# Patient Record
Sex: Male | Born: 1975 | Race: Black or African American | Hispanic: No | Marital: Single | Smoking: Former smoker
Health system: Southern US, Community
[De-identification: ages and names within clinical notes are randomized; demographics above are authoritative.]

## PROBLEM LIST (undated history)

## (undated) ENCOUNTER — Emergency Department (HOSPITAL_COMMUNITY): Discharge status: Absent without leave | Payer: Self-pay

## (undated) DIAGNOSIS — I10 Essential (primary) hypertension: Secondary | ICD-10-CM

## (undated) HISTORY — PX: KNEE SURGERY: SHX244

---

## 2011-06-12 ENCOUNTER — Emergency Department: Payer: Self-pay | Admitting: Unknown Physician Specialty

## 2011-07-23 ENCOUNTER — Emergency Department: Payer: Self-pay | Admitting: Emergency Medicine

## 2011-07-24 ENCOUNTER — Emergency Department: Payer: Self-pay | Admitting: Emergency Medicine

## 2011-07-29 ENCOUNTER — Emergency Department: Payer: Self-pay | Admitting: Emergency Medicine

## 2011-09-28 ENCOUNTER — Emergency Department: Payer: Self-pay | Admitting: Emergency Medicine

## 2011-09-30 ENCOUNTER — Emergency Department: Payer: Self-pay | Admitting: *Deleted

## 2011-10-02 ENCOUNTER — Emergency Department: Payer: Self-pay | Admitting: Emergency Medicine

## 2011-10-16 ENCOUNTER — Emergency Department: Payer: Self-pay | Admitting: Emergency Medicine

## 2011-12-24 ENCOUNTER — Emergency Department: Payer: Self-pay | Admitting: Emergency Medicine

## 2012-12-08 ENCOUNTER — Emergency Department: Payer: Self-pay | Admitting: Internal Medicine

## 2012-12-15 ENCOUNTER — Emergency Department: Payer: Self-pay | Admitting: Internal Medicine

## 2013-02-16 ENCOUNTER — Emergency Department: Payer: Self-pay | Admitting: Emergency Medicine

## 2013-02-21 ENCOUNTER — Emergency Department: Payer: Self-pay | Admitting: Emergency Medicine

## 2013-03-14 ENCOUNTER — Emergency Department: Payer: Self-pay | Admitting: Unknown Physician Specialty

## 2013-03-14 LAB — BASIC METABOLIC PANEL
Anion Gap: 7 (ref 7–16)
Chloride: 102 mmol/L (ref 98–107)
Co2: 27 mmol/L (ref 21–32)
EGFR (Non-African Amer.): >60
Osmolality: 270 (ref 275–301)
Potassium: 3.6 mmol/L (ref 3.5–5.1)
Sodium: 136 mmol/L (ref 136–145)

## 2013-03-14 LAB — CBC
HCT: 46.7 % (ref 40.0–52.0)
MCV: 79 fL — ABNORMAL LOW (ref 80–100)
Platelet: 226 10*3/uL (ref 150–440)
RBC: 5.93 10*6/uL — ABNORMAL HIGH (ref 4.40–5.90)
RDW: 13.7 % (ref 11.5–14.5)
WBC: 14.9 10*3/uL — ABNORMAL HIGH (ref 3.8–10.6)

## 2013-03-14 LAB — TROPONIN I: Troponin-I: <0.02 ng/mL

## 2013-09-26 ENCOUNTER — Emergency Department: Payer: Self-pay | Admitting: Emergency Medicine

## 2013-10-10 ENCOUNTER — Emergency Department: Payer: Self-pay | Admitting: Emergency Medicine

## 2014-10-22 ENCOUNTER — Emergency Department: Payer: Self-pay | Admitting: Physician Assistant

## 2014-11-17 ENCOUNTER — Encounter (HOSPITAL_COMMUNITY): Payer: Self-pay | Admitting: Emergency Medicine

## 2014-11-17 ENCOUNTER — Emergency Department (HOSPITAL_COMMUNITY)
Admission: EM | Admit: 2014-11-17 | Discharge: 2014-11-17 | Disposition: A | Discharge status: Home / Self Care | Payer: Self-pay | Attending: Emergency Medicine | Admitting: Emergency Medicine

## 2014-11-17 DIAGNOSIS — S76301A Unspecified injury of muscle, fascia and tendon of the posterior muscle group at thigh level, right thigh, initial encounter: Secondary | ICD-10-CM

## 2014-11-17 DIAGNOSIS — X58XXXA Exposure to other specified factors, initial encounter: Secondary | ICD-10-CM | POA: Insufficient documentation

## 2014-11-17 DIAGNOSIS — S76312A Strain of muscle, fascia and tendon of the posterior muscle group at thigh level, left thigh, initial encounter: Secondary | ICD-10-CM | POA: Insufficient documentation

## 2014-11-17 DIAGNOSIS — S76901A Unspecified injury of unspecified muscles, fascia and tendons at thigh level, right thigh, initial encounter: Secondary | ICD-10-CM | POA: Insufficient documentation

## 2014-11-17 DIAGNOSIS — Y9389 Activity, other specified: Secondary | ICD-10-CM | POA: Insufficient documentation

## 2014-11-17 DIAGNOSIS — Y998 Other external cause status: Secondary | ICD-10-CM | POA: Insufficient documentation

## 2014-11-17 DIAGNOSIS — Z720 Tobacco use: Secondary | ICD-10-CM | POA: Insufficient documentation

## 2014-11-17 DIAGNOSIS — Y929 Unspecified place or not applicable: Secondary | ICD-10-CM | POA: Insufficient documentation

## 2014-11-17 MED ORDER — OXYCODONE-ACETAMINOPHEN 5-325 MG PO TABS
1.0000 | ORAL_TABLET | Freq: Once | ORAL | Status: AC
  Administered 2014-11-17: 1 via ORAL
  Filled 2014-11-17: qty 1

## 2014-11-17 MED ORDER — OXYCODONE-ACETAMINOPHEN 5-325 MG PO TABS: 1.0000 | ORAL_TABLET | ORAL | Status: DC | PRN

## 2014-11-17 MED ORDER — DIAZEPAM 5 MG PO TABS: 5.0000 mg | ORAL_TABLET | Freq: Four times a day (QID) | ORAL | Status: DC | PRN

## 2014-11-17 MED ORDER — IBUPROFEN 800 MG PO TABS: 800.0000 mg | ORAL_TABLET | Freq: Three times a day (TID) | ORAL | Status: DC

## 2014-11-17 MED ORDER — DIAZEPAM 5 MG PO TABS
10.0000 mg | ORAL_TABLET | Freq: Once | ORAL | Status: AC
  Administered 2014-11-17: 10 mg via ORAL
  Filled 2014-11-17: qty 2

## 2014-11-17 NOTE — ED Notes (Signed)
Pt sts he was wrestling around this evening and felt a pull in bilateral hamstrings. C/o cont pain.

## 2014-11-17 NOTE — ED Provider Notes (Signed)
CSN: [id number]     Arrival date & time 11/17/14  0229 History   First MD Initiated Contact with Patient 11/17/14 0243     Chief Complaint  Patient presents with  . Leg Pain     (Consider location/radiation/quality/duration/timing/severity/associated sxs/prior Treatment) Patient is a 39 y.o. male presenting with leg pain. The history is provided by the patient and the spouse. No language interpreter was used.  Leg Pain Location:  Leg Leg location:  L upper leg and R upper leg Associated symptoms: no fever   Associated symptoms comment:  He was lifting his girlfriend/spouse up while play wrestling and felt a sudden onset searing pain in the back of bilateral thighs, making it difficult for the patient to walk or move the legs. The pain progressed over time prompting ED evaluation.    History reviewed. No pertinent past medical history. Past Surgical History  Procedure Laterality Date  . Knee surgery     No family history on file. History  Substance Use Topics  . Smoking status: Current Every Day Smoker  . Smokeless tobacco: Not on file  . Alcohol Use: No    Review of Systems  Constitutional: Negative for fever and chills.  Gastrointestinal: Negative.  Negative for nausea.  Musculoskeletal: Negative.        See HPI  Skin: Negative.  Negative for color change and wound.  Neurological: Negative.  Negative for weakness and numbness.      Allergies  Review of patient's allergies indicates no known allergies.  Home Medications   Prior to Admission medications   Not on File   BP 118/76 mmHg  Pulse 69  Temp(Src) 98.5 F (36.9 C) (Oral)  Resp 20  Ht 5\' 11"  (1.803 m)  Wt 215 lb (97.523 kg)  BMI 30.00 kg/m2  SpO2 98% Physical Exam  Constitutional: He is oriented to person, place, and time. He appears well-developed and well-nourished.  Neck: Normal range of motion.  Cardiovascular: Intact distal pulses.   Pulmonary/Chest: Effort normal.  Abdominal: There is no  tenderness.  Musculoskeletal: Normal range of motion.  No visualized deformity to posterior lower extremities. Full knee flexion with pain. No apparent tendon deficit. Muscle body of hamstrings soft, tender, without induration.   Neurological: He is alert and oriented to person, place, and time.  Skin: Skin is warm and dry. No erythema.  Psychiatric: He has a normal mood and affect.    ED Course  Procedures (including critical care time) Labs Review Labs Reviewed - No data to display  Imaging Review No results found.   EKG Interpretation None      MDM   Final diagnoses:  None    1. Bilateral hamstring strain  Do not suspect rupture of hamstrings tendons. Pain is improved with medication. Patient is able to be discharged with prn orthopedic follow up.    Elpidio AnisShari Johneisha Broaden, PA-C 11/17/14 [id number]  Mirian MoMatthew Gentry, MD 11/17/14 0600

## 2014-11-17 NOTE — Discharge Instructions (Signed)
Cryotherapy °Cryotherapy means treatment with cold. Ice or gel packs can be used to reduce both pain and swelling. Ice is the most helpful within the first 24 to 48 hours after an injury or flare-up from overusing a muscle or joint. Sprains, strains, spasms, burning pain, shooting pain, and aches can all be eased with ice. Ice can also be used when recovering from surgery. Ice is effective, has very few side effects, and is safe for most people to use. °PRECAUTIONS  °Ice is not a safe treatment option for people with: °· Raynaud phenomenon. This is a condition affecting small blood vessels in the extremities. Exposure to cold may cause your problems to return. °· Cold hypersensitivity. There are many forms of cold hypersensitivity, including: °· Cold urticaria. Red, itchy hives appear on the skin when the tissues begin to warm after being iced. °· Cold erythema. This is a red, itchy rash caused by exposure to cold. °· Cold hemoglobinuria. Red blood cells break down when the tissues begin to warm after being iced. The hemoglobin that carry oxygen are passed into the urine because they cannot combine with blood proteins fast enough. °· Numbness or altered sensitivity in the area being iced. °If you have any of the following conditions, do not use ice until you have discussed cryotherapy with your caregiver: °· Heart conditions, such as arrhythmia, angina, or chronic heart disease. °· High blood pressure. °· Healing wounds or open skin in the area being iced. °· Current infections. °· Rheumatoid arthritis. °· Poor circulation. °· Diabetes. °Ice slows the blood flow in the region it is applied. This is beneficial when trying to stop inflamed tissues from spreading irritating chemicals to surrounding tissues. However, if you expose your skin to cold temperatures for too long or without the proper protection, you can damage your skin or nerves. Watch for signs of skin damage due to cold. °HOME CARE INSTRUCTIONS °Follow  these tips to use ice and cold packs safely. °· Place a dry or damp towel between the ice and skin. A damp towel will cool the skin more quickly, so you may need to shorten the time that the ice is used. °· For a more rapid response, add gentle compression to the ice. °· Ice for no more than 10 to 20 minutes at a time. The bonier the area you are icing, the less time it will take to get the benefits of ice. °· Check your skin after 5 minutes to make sure there are no signs of a poor response to cold or skin damage. °· Rest 20 minutes or more between uses. °· Once your skin is numb, you can end your treatment. You can test numbness by very lightly touching your skin. The touch should be so light that you do not see the skin dimple from the pressure of your fingertip. When using ice, most people will feel these normal sensations in this order: cold, burning, aching, and numbness. °· Do not use ice on someone who cannot communicate their responses to pain, such as small children or people with dementia. °HOW TO MAKE AN ICE PACK °Ice packs are the most common way to use ice therapy. Other methods include ice massage, ice baths, and cryosprays. Muscle creams that cause a cold, tingly feeling do not offer the same benefits that ice offers and should not be used as a substitute unless recommended by your caregiver. °To make an ice pack, do one of the following: °· Place crushed ice or a   bag of frozen vegetables in a sealable plastic bag. Squeeze out the excess air. Place this bag inside another plastic bag. Slide the bag into a pillowcase or place a damp towel between your skin and the bag.  Mix 3 parts water with 1 part rubbing alcohol. Freeze the mixture in a sealable plastic bag. When you remove the mixture from the freezer, it will be slushy. Squeeze out the excess air. Place this bag inside another plastic bag. Slide the bag into a pillowcase or place a damp towel between your skin and the bag. SEEK MEDICAL CARE  IF:  You develop white spots on your skin. This may give the skin a blotchy (mottled) appearance.  Your skin turns blue or pale.  Your skin becomes waxy or hard.  Your swelling gets worse. MAKE SURE YOU:   Understand these instructions.  Will watch your condition.  Will get help right away if you are not doing well or get worse. Document Released: 03/17/2011 Document Revised: 12/05/2013 Document Reviewed: 03/17/2011 [facility] Patient Information 2015 [place]Care, [place]. This information is not intended to replace advice given to you by your health care provider. Make sure you discuss any questions you have with your health care provider. Hamstring Strain  Hamstrings are the large muscles in the back of the thighs. A strain or tear injury happens when there is a sudden stretch or pull on these muscles and tendons. Tendons are cord like structures that attach muscle to bone. These injuries are commonly seen in activities such as sprinting due to sudden acceleration.  DIAGNOSIS  Often the diagnosis can be made by examination. HOME CARE INSTRUCTIONS   Apply ice to the sore area for 15-5720minutes, 03-04 times per day. Do this while awake for the first 2 days. Put the ice in a plastic bag, and place a towel between the bag of ice and your skin.  Keep your knee flexed when possible. This means your foot is held off the ground slightly if you are on crutches. When lying down, a pillow under the knee will take strain off the muscles and provide some relief.  If a compression bandage such as an ace wrap was applied, use it until you are seen again. You may remove it for sleeping, showers and baths. If the wrap seems to be too tight and is uncomfortable, wrap it more loosely. If your toes or foot are getting cold or blue, it is too tight.  Walk or move around as the pain allows, or as instructed. Resume full activities as suggested by your caregiver. This is often safest when the strength of the  injured leg has nearly returned to normal.  Only take over-the-counter or prescription medicines for pain, discomfort, or fever as directed by your caregiver. SEEK MEDICAL CARE IF:   You have an increase in bruising, swelling or pain.  You notice coldness or blueness of your toes or foot.  Pain relief is not obtained with medications.  You have increasing pain in the area and seem to be getting worse rather than better.  You notice your thigh getting larger in size (this could indicate bleeding into the muscle). Document Released: 04/15/2001 Document Revised: 10/13/2011 Document Reviewed: 07/23/2008 [facility] Patient Information 2015 [place], [place]. This information is not intended to replace advice given to you by your health care provider. Make sure you discuss any questions you have with your health care provider.

## 2014-12-10 ENCOUNTER — Emergency Department (HOSPITAL_BASED_OUTPATIENT_CLINIC_OR_DEPARTMENT_OTHER)
Admission: EM | Admit: 2014-12-10 | Discharge: 2014-12-10 | Disposition: A | Discharge status: Home / Self Care | Payer: Self-pay | Attending: Emergency Medicine | Admitting: Emergency Medicine

## 2014-12-10 ENCOUNTER — Encounter (HOSPITAL_BASED_OUTPATIENT_CLINIC_OR_DEPARTMENT_OTHER): Payer: Self-pay | Admitting: *Deleted

## 2014-12-10 DIAGNOSIS — R6883 Chills (without fever): Secondary | ICD-10-CM | POA: Insufficient documentation

## 2014-12-10 DIAGNOSIS — Z87891 Personal history of nicotine dependence: Secondary | ICD-10-CM | POA: Insufficient documentation

## 2014-12-10 DIAGNOSIS — Z791 Long term (current) use of non-steroidal anti-inflammatories (NSAID): Secondary | ICD-10-CM | POA: Insufficient documentation

## 2014-12-10 DIAGNOSIS — L0201 Cutaneous abscess of face: Secondary | ICD-10-CM | POA: Insufficient documentation

## 2014-12-10 MED ORDER — HYDROCODONE-ACETAMINOPHEN 5-325 MG PO TABS: 1.0000 | ORAL_TABLET | Freq: Four times a day (QID) | ORAL | Status: DC | PRN

## 2014-12-10 MED ORDER — CLINDAMYCIN HCL 150 MG PO CAPS: 300.0000 mg | ORAL_CAPSULE | Freq: Four times a day (QID) | ORAL | Status: DC

## 2014-12-10 MED ORDER — LIDOCAINE-EPINEPHRINE (PF) 2 %-1:200000 IJ SOLN
10.0000 mL | Freq: Once | INTRAMUSCULAR | Status: DC
  Filled 2014-12-10: qty 10

## 2014-12-10 MED ORDER — SODIUM BICARBONATE 4 % IV SOLN
5.0000 mL | Freq: Once | INTRAVENOUS | Status: DC
  Filled 2014-12-10: qty 5

## 2014-12-10 MED ORDER — ACETAMINOPHEN 325 MG PO TABS
650.0000 mg | ORAL_TABLET | Freq: Once | ORAL | Status: AC
  Administered 2014-12-10: 650 mg via ORAL

## 2014-12-10 MED ORDER — HYDROCODONE-ACETAMINOPHEN 5-325 MG PO TABS
1.0000 | ORAL_TABLET | Freq: Once | ORAL | Status: AC
  Administered 2014-12-10: 1 via ORAL
  Filled 2014-12-10: qty 1

## 2014-12-10 MED ORDER — CLINDAMYCIN HCL 150 MG PO CAPS
300.0000 mg | ORAL_CAPSULE | Freq: Once | ORAL | Status: AC
  Administered 2014-12-10: 300 mg via ORAL
  Filled 2014-12-10: qty 2

## 2014-12-10 MED ORDER — ACETAMINOPHEN 325 MG PO TABS
ORAL_TABLET | ORAL | Status: AC
  Filled 2014-12-10: qty 2

## 2014-12-10 NOTE — Discharge Instructions (Signed)

## 2014-12-10 NOTE — ED Notes (Signed)
Left side jaw swelling x 2 days

## 2014-12-10 NOTE — ED Provider Notes (Signed)
CSN: [id number]     Arrival date & time 12/10/14  2003 History  This chart was scribed for Tilden FossaElizabeth Maudell Stanbrough, MD by Roxy Cedarhandni Bhalodia, ED Scribe. This patient was seen in room MH05/MH05 and the patient's care was started at 9:48 PM.   Chief Complaint  Patient presents with  . Facial Swelling   The history is provided by the patient. No language interpreter was used.   HPI Comments: Joesphine Bareerrance M Coccia is a 39 y.o. male with a PMHx of knee surgery, who presents to the Emergency Department complaining of moderate left sided facial swelling onset 2 days ago. Patient states that the pain and swelling is external. He reports associated onset of chills earlier today. He denies associated dental pain, trouble swallowing, nausea, vomiting or sore throat. Patient has not taken antibiotic medication. Patient has no known allergies. Patient is a current smoker. He denies use of alcohol. Sxs are moderate, constant, worsening.    History reviewed. No pertinent past medical history. Past Surgical History  Procedure Laterality Date  . Knee surgery     No family history on file. History  Substance Use Topics  . Smoking status: Former Games developermoker  . Smokeless tobacco: Never Used  . Alcohol Use: No   Review of Systems  HENT: Positive for facial swelling. Negative for dental problem and sore throat.   All other systems reviewed and are negative.  Allergies  Review of patient's allergies indicates no known allergies.  Home Medications   Prior to Admission medications   Medication Sig Start Date End Date Taking? Authorizing Provider  diazepam (VALIUM) 5 MG tablet Take 1 tablet (5 mg total) by mouth every 6 (six) hours as needed for muscle spasms. 11/17/14   Elpidio AnisShari Upstill, PA-C  ibuprofen (ADVIL,MOTRIN) 800 MG tablet Take 1 tablet (800 mg total) by mouth 3 (three) times daily. 11/17/14   Elpidio AnisShari Upstill, PA-C  oxyCODONE-acetaminophen (ROXICET) 5-325 MG per tablet Take 1-2 tablets by mouth every 4 (four) hours as  needed for severe pain. 11/17/14   Elpidio AnisShari Upstill, PA-C   Triage Vitals: BP 139/85 mmHg  Pulse 83  Temp(Src) 100.5 F (38.1 C) (Oral)  Resp 18  Ht 5\' 11"  (1.803 m)  Wt 220 lb (99.791 kg)  BMI 30.70 kg/m2  SpO2 100%  Physical Exam  Constitutional: He is oriented to person, place, and time. He appears well-developed and well-nourished.  HENT:  Head: Normocephalic and atraumatic.  Moderate erythema and edema to the left lower face. Focal drainage and local tenderness. Good dentition with no dental tenderness. Good trismus. Soft, submental region.  Cardiovascular: Normal rate and regular rhythm.   Pulmonary/Chest: Effort normal. No respiratory distress.  Abdominal: Soft. There is no tenderness. There is no rebound and no guarding.  Musculoskeletal: He exhibits no edema or tenderness.  Neurological: He is alert and oriented to person, place, and time.  Skin: Skin is warm and dry.  Psychiatric: He has a normal mood and affect. His behavior is normal.  Nursing note and vitals reviewed.  ED Course  Procedures (including critical care time) INCISION AND DRAINAGE Performed by: Tilden FossaEES, Janaria Mccammon Consent: Verbal consent obtained. Risks and benefits: risks, benefits and alternatives were discussed Type: abscess  Body area: face  Anesthesia: local infiltration  Incision was made with a scalpel.  Local anesthetic: lidocaine 2% with epinephrine  Anesthetic total: 2 ml  Complexity: complex Blunt dissection to break up loculations  Drainage: purulent  Drainage amount: small  Patient tolerance: Patient tolerated the procedure well with no  immediate complications.     DIAGNOSTIC STUDIES: Oxygen Saturation is 100% on RA, normal by my interpretation.    COORDINATION OF CARE: 9:51 PM- Discussed plans to drain abscess to left cheek. Pt advised of plan for treatment and pt agrees.  Labs Review Labs Reviewed - No data to display  Imaging Review No results found.   EKG  Interpretation None     MDM   Final diagnoses:  Facial abscess    Pt here with facial swelling - exam c/w abscess, no evidence of odontogenic source.  Pt is nontoxic on exam, has low grade temperature in ED.  I and D performed with drainage of small amount of fluid, cavity not large enough to pack.  Discussed home care with very close return precautions.    I personally performed the services described in this documentation, which was scribed in my presence. The recorded information has been reviewed and is accurate.   Tilden FossaElizabeth Genita Nilsson, MD 12/11/14 [phone]

## 2015-03-05 ENCOUNTER — Emergency Department (HOSPITAL_COMMUNITY)
Admission: EM | Admit: 2015-03-05 | Discharge: 2015-03-05 | Disposition: A | Discharge status: Home / Self Care | Payer: Self-pay | Attending: Emergency Medicine | Admitting: Emergency Medicine

## 2015-03-05 ENCOUNTER — Emergency Department (HOSPITAL_COMMUNITY): Payer: Self-pay

## 2015-03-05 ENCOUNTER — Encounter (HOSPITAL_COMMUNITY): Payer: Self-pay | Admitting: Emergency Medicine

## 2015-03-05 DIAGNOSIS — Z9889 Other specified postprocedural states: Secondary | ICD-10-CM | POA: Insufficient documentation

## 2015-03-05 DIAGNOSIS — Y9289 Other specified places as the place of occurrence of the external cause: Secondary | ICD-10-CM | POA: Insufficient documentation

## 2015-03-05 DIAGNOSIS — Z720 Tobacco use: Secondary | ICD-10-CM | POA: Insufficient documentation

## 2015-03-05 DIAGNOSIS — Y9367 Activity, basketball: Secondary | ICD-10-CM | POA: Insufficient documentation

## 2015-03-05 DIAGNOSIS — X58XXXA Exposure to other specified factors, initial encounter: Secondary | ICD-10-CM | POA: Insufficient documentation

## 2015-03-05 DIAGNOSIS — M1712 Unilateral primary osteoarthritis, left knee: Secondary | ICD-10-CM | POA: Insufficient documentation

## 2015-03-05 DIAGNOSIS — Y998 Other external cause status: Secondary | ICD-10-CM | POA: Insufficient documentation

## 2015-03-05 DIAGNOSIS — S8992XA Unspecified injury of left lower leg, initial encounter: Secondary | ICD-10-CM | POA: Insufficient documentation

## 2015-03-05 MED ORDER — HYDROCODONE-ACETAMINOPHEN 5-325 MG PO TABS: 1.0000 | ORAL_TABLET | Freq: Four times a day (QID) | ORAL | Status: DC | PRN

## 2015-03-05 MED ORDER — HYDROCODONE-ACETAMINOPHEN 5-325 MG PO TABS
1.0000 | ORAL_TABLET | Freq: Once | ORAL | Status: AC
  Administered 2015-03-05: 1 via ORAL
  Filled 2015-03-05: qty 1

## 2015-03-05 MED ORDER — IBUPROFEN 800 MG PO TABS: 800.0000 mg | ORAL_TABLET | Freq: Four times a day (QID) | ORAL | Status: DC | PRN

## 2015-03-05 MED ORDER — IBUPROFEN 200 MG PO TABS
600.0000 mg | ORAL_TABLET | Freq: Once | ORAL | Status: AC
  Administered 2015-03-05: 600 mg via ORAL
  Filled 2015-03-05: qty 3

## 2015-03-05 NOTE — ED Notes (Signed)
Pt reports that he injured his L knee x2 days ago playing basketball. Denies falling, but states "I think I hyperextended it." Pt reports hx surgery x10 years ago for "torn cartilage and chipped bone" to L knee. Pt denies taking medication at home for symptoms.

## 2015-03-05 NOTE — ED Notes (Signed)
Patient returned from XR. 

## 2015-03-05 NOTE — ED Provider Notes (Signed)
CSN: [id number]     Arrival date & time 03/05/15  0004 History   First MD Initiated Contact with Patient 03/05/15 0031     Chief Complaint  Patient presents with  . Knee Pain     (Consider location/radiation/quality/duration/timing/severity/associated sxs/prior Treatment) Patient is a 39 y.o. male presenting with knee pain. The history is provided by the patient.  Knee Pain Location:  Knee Time since incident:  2 days Injury: no   Knee location:  L knee Pain details:    Quality:  Aching   Radiates to:  Does not radiate   Severity:  Moderate   Onset quality:  Unable to specify   Duration:  2 days   Timing:  Constant   Progression:  Unchanged Chronicity:  New Dislocation: no   Prior injury to area:  Yes Relieved by:  Nothing Worsened by:  Activity Ineffective treatments:  Ice and elevation Associated symptoms: decreased ROM and swelling   Associated symptoms: no fever and no muscle weakness     History reviewed. No pertinent past medical history. Past Surgical History  Procedure Laterality Date  . Knee surgery     History reviewed. No pertinent family history. History  Substance Use Topics  . Smoking status: Current Some Day Smoker    Types: Cigarettes  . Smokeless tobacco: Never Used  . Alcohol Use: No    Review of Systems  Constitutional: Negative for fever.  Cardiovascular: Negative for leg swelling.  Musculoskeletal: Positive for joint swelling.  Neurological: Negative for numbness.  All other systems reviewed and are negative.     Allergies  Review of patient's allergies indicates no known allergies.  Home Medications   Prior to Admission medications   Medication Sig Start Date End Date Taking? Authorizing Provider  clindamycin (CLEOCIN) 150 MG capsule Take 2 capsules (300 mg total) by mouth 4 (four) times daily. Patient not taking: Reported on 03/05/2015 12/10/14   Tilden Fossa, MD  diazepam (VALIUM) 5 MG tablet Take 1 tablet (5 mg total) by mouth  every 6 (six) hours as needed for muscle spasms. Patient not taking: Reported on 03/05/2015 11/17/14   Elpidio Anis, PA-C  HYDROcodone-acetaminophen (NORCO/VICODIN) 5-325 MG per tablet Take 1 tablet by mouth every 6 (six) hours as needed for severe pain. 03/05/15   Earley Favor, NP  ibuprofen (ADVIL,MOTRIN) 800 MG tablet Take 1 tablet (800 mg total) by mouth every 6 (six) hours as needed for moderate pain. 03/05/15   Earley Favor, NP  oxyCODONE-acetaminophen (ROXICET) 5-325 MG per tablet Take 1-2 tablets by mouth every 4 (four) hours as needed for severe pain. Patient not taking: Reported on 03/05/2015 11/17/14   Elpidio Anis, PA-C   BP 127/87 mmHg  Pulse 85  Temp(Src) 98.1 F (36.7 C) (Oral)  Resp 16  SpO2 96% Physical Exam  Constitutional: He is oriented to person, place, and time. He appears well-nourished.  HENT:  Head: Normocephalic.  Eyes: Pupils are equal, round, and reactive to light.  Neck: Normal range of motion.  Cardiovascular: Normal rate and regular rhythm.   Musculoskeletal: He exhibits edema and tenderness.       Left knee: He exhibits no deformity and no erythema. Tenderness found.  Neurological: He is alert and oriented to person, place, and time.  Skin: Skin is warm and dry. No erythema.  Nursing note and vitals reviewed.   ED Course  Procedures (including critical care time) Labs Review Labs Reviewed - No data to display  Imaging Review Dg Knee Complete 4  Views Left  03/05/2015   CLINICAL DATA:  Generalized left knee pain. Injury playing basketball 2 days prior. Prior left knee injury and surgery.  EXAM: LEFT KNEE - COMPLETE 4+ VIEW  COMPARISON:  None.  FINDINGS: No acute fracture or dislocation. Mild patellofemoral space narrowing. There are large tricompartmental osteophytes. Suspect heterotopic ossification adjacent to the lateral patella. There is a moderate joint effusion. Low corticated osseous density projecting posteriorly, may reflect a fragmented osteophyte versus  intra-articular body. Fragmented anterior tibial tubercle apophysis.  IMPRESSION: 1. No acute fracture or dislocation. 2. Moderate joint effusion. 3. 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 Questionable fragmented osteophyte versus intra-articular body posteriorly.   Electronically Signed   By: Rubye Oaks M.D.   On: 03/05/2015 01:28     EKG Interpretation None      MDM   Final diagnoses:  Osteoarthritis of left knee, unspecified osteoarthritis type         Earley Favor, NP 03/05/15 0136  Dione Booze, MD 03/05/15 [phone]

## 2015-03-05 NOTE — Progress Notes (Signed)
Orthopedic Tech Progress Note Patient Details:  Barry Novak 1975-09-23 [id number]  Ortho Devices Type of Ortho Device: Knee Immobilizer, Crutches   Cammer, Mickie Bail 03/05/2015, 8:01 PM

## 2015-03-05 NOTE — Discharge Instructions (Signed)
Your x-ray shows that you have advanced arthritis in your knee even placed in a knee immobilizer and given crutches to help with ambulation and inflammatory to take on a regular basis as well as a normal cardiac and you can take severe pain you've been given a referral to orthopedics to further assess your condition

## 2015-03-20 ENCOUNTER — Encounter (HOSPITAL_COMMUNITY): Payer: Self-pay | Admitting: *Deleted

## 2015-03-20 ENCOUNTER — Emergency Department (HOSPITAL_COMMUNITY)
Admission: EM | Admit: 2015-03-20 | Discharge: 2015-03-20 | Disposition: A | Discharge status: Home / Self Care | Payer: PRIVATE HEALTH INSURANCE | Attending: Emergency Medicine | Admitting: Emergency Medicine

## 2015-03-20 DIAGNOSIS — G8929 Other chronic pain: Secondary | ICD-10-CM | POA: Insufficient documentation

## 2015-03-20 DIAGNOSIS — M25562 Pain in left knee: Secondary | ICD-10-CM

## 2015-03-20 DIAGNOSIS — M25462 Effusion, left knee: Secondary | ICD-10-CM | POA: Diagnosis not present

## 2015-03-20 DIAGNOSIS — Z720 Tobacco use: Secondary | ICD-10-CM | POA: Insufficient documentation

## 2015-03-20 DIAGNOSIS — M545 Low back pain: Secondary | ICD-10-CM | POA: Insufficient documentation

## 2015-03-20 MED ORDER — CYCLOBENZAPRINE HCL 5 MG PO TABS: 5.0000 mg | ORAL_TABLET | Freq: Three times a day (TID) | ORAL | Status: DC | PRN

## 2015-03-20 MED ORDER — NAPROXEN 375 MG PO TABS: 375.0000 mg | ORAL_TABLET | Freq: Two times a day (BID) | ORAL | Status: DC

## 2015-03-20 NOTE — Discharge Instructions (Signed)
Use ice and heat on your back, ice will help with the swelling in your knee. You need to follow up with an orthopedist about your knee. Call Dr Debroah Loop office to get an appointment. You also need to get a primary care doctor. You can try in the phone book, call your insurance company to see if they can help you get a primary care doctor, or try the [facility] or [facility].    Back Exercises Back exercises help treat and prevent back injuries. The goal of back exercises is to increase the strength of your abdominal and back muscles and the flexibility of your back. These exercises should be started when you no longer have back pain. Back exercises include:  Pelvic Tilt. Lie on your back with your knees bent. Tilt your pelvis until the lower part of your back is against the floor. Hold this position 5 to 10 sec and repeat 5 to 10 times.  Knee to Chest. Pull first 1 knee up against your chest and hold for 20 to 30 seconds, repeat this with the other knee, and then both knees. This may be done with the other leg straight or bent, whichever feels better.  Sit-Ups or Curl-Ups. Bend your knees 90 degrees. Start with tilting your pelvis, and do a partial, slow sit-up, lifting your trunk only 30 to 45 degrees off the floor. Take at least 2 to 3 seconds for each sit-up. Do not do sit-ups with your knees out straight. If partial sit-ups are difficult, simply do the above but with only tightening your abdominal muscles and holding it as directed.  Hip-Lift. Lie on your back with your knees flexed 90 degrees. Push down with your feet and shoulders as you raise your hips a couple inches off the floor; hold for 10 seconds, repeat 5 to 10 times.  Back arches. Lie on your stomach, propping yourself up on bent elbows. Slowly press on your hands, causing an arch in your low back. Repeat 3 to 5 times. Any initial stiffness and discomfort should lessen with repetition over time.  Shoulder-Lifts. Lie face  down with arms beside your body. Keep hips and torso pressed to floor as you slowly lift your head and shoulders off the floor. Do not overdo your exercises, especially in the beginning. Exercises may cause you some mild back discomfort which lasts for a few minutes; however, if the pain is more severe, or lasts for more than 15 minutes, do not continue exercises until you see your caregiver. Improvement with exercise therapy for back problems is slow.  See your caregivers for assistance with developing a proper back exercise program. Document Released: 08/28/2004 Document Revised: 10/13/2011 Document Reviewed: 05/22/2011 [facility] Patient Information 2015 [place], [place]. This information is not intended to replace advice given to you by your health care provider. Make sure you discuss any questions you have with your health care provider.  Back Injury Prevention Back injuries can be extremely painful and difficult to heal. After having one back injury, you are much more likely to experience another later on. It is important to learn how to avoid injuring or re-injuring your back. The following tips can help you to prevent a back injury. PHYSICAL FITNESS  Exercise regularly and try to develop good tone in your abdominal muscles. Your abdominal muscles provide a lot of the support needed by your back.  Do aerobic exercises (walking, jogging, biking, swimming) regularly.  Do exercises that increase balance and strength (tai chi, yoga)  regularly. This can decrease your risk of falling and injuring your back.  Stretch before and after exercising.  Maintain a healthy weight. The more you weigh, the more stress is placed on your back. For every pound of weight, 10 times that amount of pressure is placed on the back. DIET  Talk to your caregiver about how much calcium and vitamin D you need per day. These nutrients help to prevent weakening of the bones (osteoporosis). Osteoporosis can cause broken  (fractured) bones that lead to back pain.  Include good sources of calcium in your diet, such as dairy products, green, leafy vegetables, and products with calcium added (fortified).  Include good sources of vitamin D in your diet, such as milk and foods that are fortified with vitamin D.  Consider taking a nutritional supplement or a multivitamin if needed.  Stop smoking if you smoke. POSTURE  Sit and stand up straight. Avoid leaning forward when you sit or hunching over when you stand.  Choose chairs with good low back (lumbar) support.  If you work at a desk, sit close to your work so you do not need to lean over. Keep your chin tucked in. Keep your neck drawn back and elbows bent at a right angle. Your arms should look like the letter "L."  Sit high and close to the steering wheel when you drive. Add a lumbar support to your car seat if needed.  Avoid sitting or standing in one position for too long. Take breaks to get up, stretch, and walk around at least once every hour. Take breaks if you are driving for long periods of time.  Sleep on your side with your knees slightly bent, or sleep on your back with a pillow under your knees. Do not sleep on your stomach. LIFTING, TWISTING, AND REACHING  Avoid heavy lifting, especially repetitive lifting. If you must do heavy lifting:  Stretch before lifting.  Work slowly.  Rest between lifts.  Use carts and dollies to move objects when possible.  Make several small trips instead of carrying 1 heavy load.  Ask for help when you need it.  Ask for help when moving big, awkward objects.  Follow these steps when lifting:  Stand with your feet shoulder-width apart.  Get as close to the object as you can. Do not try to pick up heavy objects that are far from your body.  Use handles or lifting straps if they are available.  Bend at your knees. Squat down, but keep your heels off the floor.  Keep your shoulders pulled back, your  chin tucked in, and your back straight.  Lift the object slowly, tightening the muscles in your legs, abdomen, and buttocks. Keep the object as close to the center of your body as possible.  When you put a load down, use these same guidelines in reverse.  Do not:  Lift the object above your waist.  Twist at the waist while lifting or carrying a load. Move your feet if you need to turn, not your waist.  Bend over without bending at your knees.  Avoid reaching over your head, across a table, or for an object on a high surface. OTHER TIPS  Avoid wet floors and keep sidewalks clear of ice to prevent falls.  Do not sleep on a mattress that is too soft or too hard.  Keep items that are used frequently within easy reach.  Put heavier objects on shelves at waist level and lighter objects on lower  or higher shelves.  Find ways to decrease your stress, such as exercise, massage, or relaxation techniques. Stress can build up in your muscles. Tense muscles are more vulnerable to injury.  Seek treatment for depression or anxiety if needed. These conditions can increase your risk of developing back pain. SEEK MEDICAL CARE IF:  You injure your back.  You have questions about diet, exercise, or other ways to prevent back injuries. MAKE SURE YOU:  Understand these instructions.  Will watch your condition.  Will get help right away if you are not doing well or get worse. Document Released: 08/28/2004 Document Revised: 10/13/2011 Document Reviewed: 09/01/2011 [facility] Patient Information 2015 [place], [place]. This information is not intended to replace advice given to you by your health care provider. Make sure you discuss any questions you have with your health care provider.  Cryotherapy Cryotherapy is when you put ice on your injury. Ice helps lessen pain and puffiness (swelling) after an injury. Ice works the best when you start using it in the first 24 to 48 hours after an injury. HOME  CARE  Put a dry or damp towel between the ice pack and your skin.  You may press gently on the ice pack.  Leave the ice on for no more than 10 to 20 minutes at a time.  Check your skin after 5 minutes to make sure your skin is okay.  Rest at least 20 minutes between ice pack uses.  Stop using ice when your skin loses feeling (numbness).  Do not use ice on someone who cannot tell you when it hurts. This includes small children and people with memory problems (dementia). GET HELP RIGHT AWAY IF:  You have white spots on your skin.  Your skin turns blue or pale.  Your skin feels waxy or hard.  Your puffiness gets worse. MAKE SURE YOU:   Understand these instructions.  Will watch your condition.  Will get help right away if you are not doing well or get worse. Document Released: 01/07/2008 Document Revised: 10/13/2011 Document Reviewed: 03/13/2011 [facility] Patient Information 2015 [place], [place]. This information is not intended to replace advice given to you by your health care provider. Make sure you discuss any questions you have with your health care provider.  Heat Therapy Heat therapy can help make painful, stiff muscles and joints feel better. Do not use heat on new injuries. Wait at least 48 hours after an injury to use heat. Do not use heat when you have aches or pains right after an activity. If you still have pain 3 hours after stopping the activity, then you may use heat. HOME CARE Wet heat pack  Soak a clean towel in warm water. Squeeze out the extra water.  Put the warm, wet towel in a plastic bag.  Place a thin, dry towel between your skin and the bag.  Put the heat pack on the area for 5 minutes, and check your skin. Your skin may be pink, but it should not be red.  Leave the heat pack on the area for 15 to 30 minutes.  Repeat this every 2 to 4 hours while awake. Do not use heat while you are sleeping. Warm water bath  Fill a tub with warm  water.  Place the affected body part in the tub.  Soak the area for 20 to 40 minutes.  Repeat as needed. Hot water bottle  Fill the water bottle half full with hot water.  Press out the extra air.  Close the cap tightly.  Place a dry towel between your skin and the bottle.  Put the bottle on the area for 5 minutes, and check your skin. Your skin may be pink, but it should not be red.  Leave the bottle on the area for 15 to 30 minutes.  Repeat this every 2 to 4 hours while awake. Electric heating pad  Place a dry towel between your skin and the heating pad.  Set the heating pad on low heat.  Put the heating pad on the area for 10 minutes, and check your skin. Your skin may be pink, but it should not be red.  Leave the heating pad on the area for 20 to 40 minutes.  Repeat this every 2 to 4 hours while awake.  Do not lie on the heating pad.  Do not fall asleep while using the heating pad.  Do not use the heating pad near water. GET HELP RIGHT AWAY IF:  You get blisters or red skin.  Your skin is puffy (swollen), or you lose feeling (numbness) in the affected area.  You have any new problems.  Your problems are getting worse.  You have any questions or concerns. If you have any problems, stop using heat therapy until you see your doctor. MAKE SURE YOU:  Understand these instructions.  Will watch your condition.  Will get help right away if you are not doing well or get worse. Document Released: 10/13/2011 Document Reviewed: 09/13/2013 [facility] Patient Information 2015 [place]. This information is not intended to replace advice given to you by your health care provider. Make sure you discuss any questions you have with your health care provider.  Knee Effusion  Knee effusion means you have fluid in your knee. The knee may be more difficult to bend and move. HOME CARE  Use crutches or a brace as told by your doctor.  Put ice on the injured  area.  Put ice in a plastic bag.  Place a towel between your skin and the bag.  Leave the ice on for 15-20 minutes, 03-04 times a day.  Raise (elevate) your knee as much as possible.  Only take medicine as told by your doctor.  You may need to do strengthening exercises. Ask your doctor.  Continue with your normal diet and activities as told by your doctor. GET HELP RIGHT AWAY IF:  You have more puffiness (swelling) in your knee.  You see redness, puffiness, or have more pain in your knee.  You have a temperature by mouth above 102 F (38.9 C).  You get a rash.  You have trouble breathing.  You have a reaction to any medicine you are taking.  You have a lot of pain when you move your knee. MAKE SURE YOU:  Understand these instructions.  Will watch your condition.  Will get help right away if you are not doing well or get worse. Document Released: 08/23/2010 Document Revised: 10/13/2011 Document Reviewed: 08/23/2010 [facility] Patient Information 2015 [place], [place]. This information is not intended to replace advice given to you by your health care provider. Make sure you discuss any questions you have with your health care provider.

## 2015-03-20 NOTE — ED Provider Notes (Signed)
CSN: [id number]     Arrival date & time 03/20/15  0032 History   This chart was scribed for Devoria Albe, MD by Evon Slack, ED Scribe. This patient was seen in room A08C/A08C and the patient's care was started at 1:10 AM.      Chief Complaint  Patient presents with  . Knee Pain  . Back Pain   Patient is a 39 y.o. male presenting with knee pain and back pain. The history is provided by the patient. No language interpreter was used.  Knee Pain Associated symptoms: back pain   Back Pain Associated symptoms: no numbness    HPI Comments: Barry Novak is a 39 y.o. male who presents to the Emergency Department complaining of left knee pain onset 2 weeks prior. Pt states that he initially twisted the knee while playing basket ball. He states that he jumped up and when he landed the knee twisted. Pt state that he has associated swelling in the left knee. He states that the pain is worse with movement. Pt states that he was evaluated here in the ED for the same knee pain and has not improved. He states that he was not able to follow up with an orthopedist after his last visit due to not having insurance. He states his knee pain is not worse if she is not getting better. He was given a knee immobilizer to use which he states he has been wearing although he does not have it on tonight.   Pt is also complaining of chronic back pain that has recently worsened 1 month prior. Pt describes the back pain as a tightness. 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 Denies numbness, or bowel/bladder incontinence.   No PCP   History reviewed. No pertinent past medical history. Past Surgical History  Procedure Laterality Date  . Knee surgery     No family history on file. Social History  Substance Use Topics  . Smoking status: Current Some Day Smoker    Types: Cigarettes   . Smokeless tobacco: Never Used  . Alcohol Use: No   Pt states that he smokes about 7 cigarettes per day.  Pt denies alcohol use employed   Review of Systems  Genitourinary: Negative.   Musculoskeletal: Positive for back pain, joint swelling and arthralgias.  Neurological: Negative for numbness.  All other systems reviewed and are negative.    Allergies  Review of patient's allergies indicates no known allergies.  Home Medications   Prior to Admission medications   Medication Sig Start Date End Date Taking? Authorizing Provider  clindamycin (CLEOCIN) 150 MG capsule Take 2 capsules (300 mg total) by mouth 4 (four) times daily. Patient not taking: Reported on 03/05/2015 12/10/14   Tilden Fossa, MD  cyclobenzaprine (FLEXERIL) 5 MG tablet Take 1 tablet (5 mg total) by mouth 3 (three) times daily as needed (muscle pain). 03/20/15   Devoria Albe, MD  diazepam (VALIUM) 5 MG tablet Take 1 tablet (5 mg total) by mouth every 6 (six) hours as needed for muscle spasms. Patient not taking: Reported on 03/05/2015 11/17/14   Elpidio Anis, PA-C  HYDROcodone-acetaminophen (NORCO/VICODIN) 5-325 MG per tablet Take 1 tablet by mouth every 6 (six) hours as needed for severe pain. Patient not taking: Reported on 03/20/2015 03/05/15   Earley Favor, NP  ibuprofen (  ADVIL,MOTRIN) 800 MG tablet Take 1 tablet (800 mg total) by mouth every 6 (six) hours as needed for moderate pain. Patient not taking: Reported on 03/20/2015 03/05/15   Earley Favor, NP  naproxen (NAPROSYN) 375 MG tablet Take 1 tablet (375 mg total) by mouth 2 (two) times daily. 03/20/15   Devoria Albe, MD  oxyCODONE-acetaminophen (ROXICET) 5-325 MG per tablet Take 1-2 tablets by mouth every 4 (four) hours as needed for severe pain. Patient not taking: Reported on 03/05/2015 11/17/14   Elpidio Anis, PA-C   BP 121/63 mmHg  Pulse 76  Temp(Src) 97.9 F (36.6 C) (Oral)  Resp 18  Ht 5\' 11"  (1.803 m)  Wt 210 lb (95.255 kg)  BMI 29.30 kg/m2  SpO2 99%  Vital  signs normal     Physical Exam  Constitutional: He is oriented to person, place, and time. He appears well-developed and well-nourished.  Non-toxic appearance. He does not appear ill. No distress.  HENT:  Head: Normocephalic and atraumatic.  Right Ear: External ear normal.  Left Ear: External ear normal.  Nose: Nose normal. No mucosal edema or rhinorrhea.  Mouth/Throat: Mucous membranes are normal. No dental abscesses or uvula swelling.  Eyes: Conjunctivae and EOM are normal.  Neck: Normal range of motion and full passive range of motion without pain.  Pulmonary/Chest: Effort normal. No respiratory distress. He has no rhonchi. He exhibits no crepitus.  Abdominal: Soft. Normal appearance and bowel sounds are normal.  Musculoskeletal: Normal range of motion. He exhibits edema and tenderness.       Back:  Moves all extremities well. Tender in lower lumbar and sacral area and bilateral SIJ's.  Negative SLR.  He has pain on range of motion in all directions.  Mild joint effusion in left knee. Diffuse tenderness in left knee.  Neurological: He is alert and oriented to person, place, and time. He has normal strength. No cranial nerve deficit.  Skin: Skin is warm, dry and intact. No rash noted. No erythema. No pallor.  Psychiatric: He has a normal mood and affect. His speech is normal and behavior is normal. His mood appears not anxious.  Nursing note and vitals reviewed.   ED Course  Procedures (including critical care time)    DIAGNOSTIC STUDIES: Oxygen Saturation is 95% on RA, adequate by my interpretation.    COORDINATION OF CARE: 2:45 AM-Discussed treatment plan with pt at bedside and pt agreed to plan.   Patient's knee x-ray was reviewed from his prior visit. 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  Dg Knee Complete 4 Views Left  03/05/2015   CLINICAL DATA:  Generalized left knee pain. Injury playing basketball 2 days prior.  Prior left knee injury and surgery.  EXAM: LEFT KNEE - COMPLETE 4+ VIEW  COMPARISON:  None.  FINDINGS: No acute fracture or dislocation. Mild patellofemoral space narrowing. There are large tricompartmental osteophytes. Suspect heterotopic ossification adjacent to the lateral patella. There is a moderate joint effusion. Low corticated osseous density projecting posteriorly, may reflect a fragmented osteophyte versus intra-articular body. Fragmented anterior tibial tubercle apophysis.  IMPRESSION: 1. No acute fracture or dislocation. 2. Moderate joint effusion. 3. 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 Questionable fragmented osteophyte versus intra-articular body posteriorly.   Electronically Signed   By: Rubye Oaks M.D.   On: 03/05/2015 01:28     MDM   Final diagnoses:  Acute exacerbation of chronic low back pain  Knee pain, left  Knee effusion, left    Discharge Medication List as of 03/20/2015  2:49 AM    START taking these medications   Details  cyclobenzaprine (FLEXERIL) 5 MG tablet Take 1 tablet (5 mg total) by mouth 3 (three) times daily as needed (muscle pain)., Starting 03/20/2015, Until Discontinued, Print    naproxen (NAPROSYN) 375 MG tablet Take 1 tablet (375 mg total) by mouth 2 (two) times daily., Starting 03/20/2015, Until Discontinued, Print        Plan discharge  Plan discharge   I personally performed the services described in this documentation, which was scribed in my presence. The recorded information has been reviewed and considered.  Devoria Albe, MD, Concha Pyo, MD 03/20/15 [phone]

## 2015-03-20 NOTE — ED Notes (Signed)
Patient stated about 2 weeks ago he twisted his left knee and now his back is hurting.  Was seen for the same

## 2015-04-16 ENCOUNTER — Emergency Department: Payer: PRIVATE HEALTH INSURANCE

## 2015-04-16 ENCOUNTER — Emergency Department
Admission: EM | Admit: 2015-04-16 | Discharge: 2015-04-16 | Disposition: A | Discharge status: Home / Self Care | Payer: PRIVATE HEALTH INSURANCE | Attending: Emergency Medicine | Admitting: Emergency Medicine

## 2015-04-16 ENCOUNTER — Encounter: Payer: Self-pay | Admitting: Emergency Medicine

## 2015-04-16 DIAGNOSIS — Y998 Other external cause status: Secondary | ICD-10-CM | POA: Insufficient documentation

## 2015-04-16 DIAGNOSIS — S39012A Strain of muscle, fascia and tendon of lower back, initial encounter: Secondary | ICD-10-CM | POA: Insufficient documentation

## 2015-04-16 DIAGNOSIS — Y9389 Activity, other specified: Secondary | ICD-10-CM | POA: Insufficient documentation

## 2015-04-16 DIAGNOSIS — S161XXA Strain of muscle, fascia and tendon at neck level, initial encounter: Secondary | ICD-10-CM | POA: Insufficient documentation

## 2015-04-16 DIAGNOSIS — Z720 Tobacco use: Secondary | ICD-10-CM | POA: Diagnosis not present

## 2015-04-16 DIAGNOSIS — Y92410 Unspecified street and highway as the place of occurrence of the external cause: Secondary | ICD-10-CM | POA: Insufficient documentation

## 2015-04-16 DIAGNOSIS — S3992XA Unspecified injury of lower back, initial encounter: Secondary | ICD-10-CM | POA: Diagnosis present

## 2015-04-16 DIAGNOSIS — T148XXA Other injury of unspecified body region, initial encounter: Secondary | ICD-10-CM

## 2015-04-16 MED ORDER — DIAZEPAM 5 MG PO TABS
10.0000 mg | ORAL_TABLET | Freq: Once | ORAL | Status: AC
  Administered 2015-04-16: 10 mg via ORAL
  Filled 2015-04-16: qty 2

## 2015-04-16 MED ORDER — IBUPROFEN 800 MG PO TABS: 800.0000 mg | ORAL_TABLET | Freq: Three times a day (TID) | ORAL | Status: DC | PRN

## 2015-04-16 MED ORDER — DIAZEPAM 5 MG PO TABS: 5.0000 mg | ORAL_TABLET | Freq: Three times a day (TID) | ORAL | Status: DC | PRN

## 2015-04-16 MED ORDER — IBUPROFEN 800 MG PO TABS
800.0000 mg | ORAL_TABLET | Freq: Once | ORAL | Status: AC
  Administered 2015-04-16: 800 mg via ORAL
  Filled 2015-04-16: qty 1
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
## 2016-01-13 ENCOUNTER — Emergency Department
Admission: EM | Admit: 2016-01-13 | Discharge: 2016-01-13 | Disposition: A | Discharge status: Home / Self Care | Payer: No Typology Code available for payment source | Attending: Emergency Medicine | Admitting: Emergency Medicine

## 2016-01-13 DIAGNOSIS — J4520 Mild intermittent asthma, uncomplicated: Secondary | ICD-10-CM | POA: Insufficient documentation

## 2016-01-13 DIAGNOSIS — K029 Dental caries, unspecified: Secondary | ICD-10-CM

## 2016-01-13 DIAGNOSIS — F17210 Nicotine dependence, cigarettes, uncomplicated: Secondary | ICD-10-CM | POA: Insufficient documentation

## 2016-01-13 DIAGNOSIS — IMO0001 Reserved for inherently not codable concepts without codable children: Secondary | ICD-10-CM

## 2016-01-13 DIAGNOSIS — R030 Elevated blood-pressure reading, without diagnosis of hypertension: Secondary | ICD-10-CM | POA: Insufficient documentation

## 2016-01-13 DIAGNOSIS — Z760 Encounter for issue of repeat prescription: Secondary | ICD-10-CM

## 2016-01-13 MED ORDER — CLINDAMYCIN HCL 150 MG PO CAPS: 300.0000 mg | ORAL_CAPSULE | Freq: Three times a day (TID) | ORAL | Status: DC

## 2016-01-13 MED ORDER — HYDROCHLOROTHIAZIDE 25 MG PO TABS
25.0000 mg | ORAL_TABLET | Freq: Every day | ORAL | Status: DC
  Administered 2016-01-13: 25 mg via ORAL

## 2016-01-13 MED ORDER — HYDROCODONE-ACETAMINOPHEN 5-325 MG PO TABS
1.0000 | ORAL_TABLET | Freq: Once | ORAL | Status: AC
  Administered 2016-01-13: 1 via ORAL
  Filled 2016-01-13: qty 1

## 2016-01-13 MED ORDER — ALBUTEROL SULFATE HFA 108 (90 BASE) MCG/ACT IN AERS: 2.0000 | INHALATION_SPRAY | Freq: Four times a day (QID) | RESPIRATORY_TRACT | Status: DC | PRN

## 2016-01-13 MED ORDER — HYDROCHLOROTHIAZIDE 25 MG PO TABS
25.0000 mg | ORAL_TABLET | Freq: Every day | ORAL | Status: DC
  Filled 2016-01-13: qty 1

## 2016-01-13 MED ORDER — HYDROCHLOROTHIAZIDE 25 MG PO TABS: 25.0000 mg | ORAL_TABLET | Freq: Every day | ORAL | Status: DC

## 2016-01-13 MED ORDER — HYDROCODONE-ACETAMINOPHEN 5-325 MG PO TABS: 1.0000 | ORAL_TABLET | ORAL | Status: DC | PRN

## 2016-01-13 NOTE — ED Provider Notes (Signed)
[facility] Emergency Department Provider Note  ____________________________________________  Time seen: Approximately 7:31 AM  I have reviewed the triage vital signs and the nursing notes.   HISTORY  Chief Complaint Facial Swelling and Dental Pain   HPI Barry Novak is a 40 y.o. male is here with complaint of dental pain right upper molar which is causing some swelling to the right side of his face. Patient states he started yesterday.Patient has taken over-the-counter medication with minimal relief. Patient states that he does have an appointment with a dentist but is unable to recall the dentist name. He also has history of asthma and has been out of his inhaler for approximately 2 months. He states that he does not have a PCP at this time. Currently he rates his pain as a 10 over 10.   No past medical history on file.  There are no active problems to display for this patient.   Past Surgical History  Procedure Laterality Date  . Knee surgery      Current Outpatient Rx  Name  Route  Sig  Dispense  Refill  . albuterol (PROVENTIL HFA;VENTOLIN HFA) 108 (90 Base) MCG/ACT inhaler   Inhalation   Inhale 2 puffs into the lungs every 6 (six) hours as needed for wheezing or shortness of breath.   1 Inhaler   2   . clindamycin (CLEOCIN) 150 MG capsule   Oral   Take 2 capsules (300 mg total) by mouth 3 (three) times daily.   56 capsule   0   . cyclobenzaprine (FLEXERIL) 5 MG tablet   Oral   Take 1 tablet (5 mg total) by mouth 3 (three) times daily as needed (muscle pain).   30 tablet   0   . hydrochlorothiazide (HYDRODIURIL) 25 MG tablet   Oral   Take 1 tablet (25 mg total) by mouth daily.   30 tablet   0   . HYDROcodone-acetaminophen (NORCO/VICODIN) 5-325 MG tablet   Oral   Take 1 tablet by mouth every 4 (four) hours as needed for moderate pain.   20 tablet   0     Allergies Review of patient's allergies indicates no known  allergies.  No family history on file.  Social History Social History  Substance Use Topics  . Smoking status: Current Some Day Smoker    Types: Cigarettes  . Smokeless tobacco: Never Used  . Alcohol Use: No    Review of Systems Constitutional: No fever/chills Eyes: No visual changes. ENT: No sore throat.Positive dental pain. Cardiovascular: Denies chest pain. Respiratory: Denies shortness of breath. Positive wheezing. Gastrointestinal: No abdominal pain.  No nausea, no vomiting.  Musculoskeletal: Negative for back pain. Skin: Negative for rash. Neurological: Negative for headaches, focal weakness or numbness.  10-point ROS otherwise negative.  ____________________________________________   PHYSICAL EXAM:  VITAL SIGNS: ED Triage Vitals  Enc Vitals Group     BP 01/13/16 0702 155/100 mmHg     Pulse Rate 01/13/16 0702 75     Resp 01/13/16 0702 18     Temp 01/13/16 0702 98.2 F (36.8 C)     Temp Source 01/13/16 0702 Oral     SpO2 01/13/16 0702 97 %     Weight 01/13/16 0702 215 lb (97.523 kg)     Height 01/13/16 0702  (1.803 m)     Head Cir --      Peak Flow --      Pain Score 01/13/16 0659 10  Pain Loc --      Pain Edu? --      Excl. in GC? --     Constitutional: Alert and oriented. Well appearing and in no acute distress. Eyes: Conjunctivae are normal. PERRL. EOMI. Head: Atraumatic. Nose: No congestion/rhinnorhea. Mouth/Throat: Mucous membranes are moist.  Oropharynx non-erythematous.Right upper molar approximately #3 barely above the gumline. Area is swollen and tender to palpation with a tongue depressor. No active drainage was noted. Neck: No stridor.   Hematological/Lymphatic/Immunilogical: No cervical lymphadenopathy. Cardiovascular: Normal rate, regular rhythm. Grossly normal heart sounds.  Good peripheral circulation. Respiratory: Normal respiratory effort.  No retractions. Lungs CTAB. Gastrointestinal: Soft and nontender. No distention.    Musculoskeletal: Moves upper and lower extremities without any difficulty. Normal gait was noted. Neurologic:  Normal speech and language. No gross focal neurologic deficits are appreciated.  Skin:  Skin is warm, dry and intact. No rash noted. Psychiatric: Mood and affect are normal. Speech and behavior are normal.  ____________________________________________   LABS (all labs ordered are listed, but only abnormal results are displayed)  Labs Reviewed - No data to display   PROCEDURES  Procedure(s) performed: None  Critical Care performed: No  ____________________________________________   INITIAL IMPRESSION / ASSESSMENT AND PLAN / ED COURSE  Pertinent labs & imaging results that were available during my care of the patient were reviewed by me and considered in my medical decision making (see chart for details).  Patient was started on hydrochlorothiazide 25 mg while in the emergency room as his blood pressure was elevated. Patient has a family history of hypertension but has never been told that he himself has hypertension. He is instructed to follow-up with a doctor for further evaluation of his elevated blood pressure in the emergency room today. Currently he is asymptomatic from his elevated blood pressure. He is also given a prescription for clindamycin 150 mg 2 to capsules 3 times a day for 7 days and also a refill of his albuterol inhaler that he uses for his asthma. Patient was also given information about [facility] for recheck of his blood pressure if needed. Patient states that he has an appointment already scheduled with a dentist but cannot give the name of the dentist. ____________________________________________   FINAL CLINICAL IMPRESSION(S) / ED DIAGNOSES  Final diagnoses:  Pain due to dental caries  Elevated blood pressure  Asthma, mild intermittent, uncomplicated  Encounter for medication refill      NEW MEDICATIONS STARTED DURING THIS  VISIT:  Discharge Medication List as of 01/13/2016  9:22 AM    START taking these medications   Details  albuterol (PROVENTIL HFA;VENTOLIN HFA) 108 (90 Base) MCG/ACT inhaler Inhale 2 puffs into the lungs every 6 (six) hours as needed for wheezing or shortness of breath., Starting 01/13/2016, Until Discontinued, Print    hydrochlorothiazide (HYDRODIURIL) 25 MG tablet Take 1 tablet (25 mg total) by mouth daily., Starting 01/13/2016, Until Discontinued, Print         Note:  This document was prepared using Dragon voice recognition software and may include unintentional dictation errors.    Tommi Rumpshonda L Charyl Minervini, PA-C 01/13/16 1505  Minna AntisKevin Paduchowski, MD 01/13/16 1525

## 2016-01-13 NOTE — Discharge Instructions (Signed)
Dental Caries Dental caries is tooth decay. This decay can cause a hole in teeth (cavity) that can get bigger and deeper over time. HOME CARE  Brush and floss your teeth. Do this at least two times a day.  Use a fluoride toothpaste.  Use a mouth rinse if told by your dentist or doctor.  Eat less sugary and starchy foods. Drink less sugary drinks.  Avoid snacking often on sugary and starchy foods. Avoid sipping often on sugary drinks.  Keep regular checkups and cleanings with your dentist.  Use fluoride supplements if told by your dentist or doctor.  Allow fluoride to be applied to teeth if told by your dentist or doctor.   This information is not intended to replace advice given to you by your health care provider. Make sure you discuss any questions you have with your health care provider.   Document Released: 04/29/2008 Document Revised: 08/11/2014 Document Reviewed: 07/23/2012 Else[employer] Interactive Patient Education 2016 [employer]    Keep your appointment with the dentist  Take anitibiotics until finished.   He may take Tylenol as needed for dental pain. Albuterol as needed for wheezing.  [facility] up with a doctor at [facility] and have your blood pressure rechecked as it was elevated in the emergency room today. Begin taking hydrochlorothiazide daily. This is a fluid pill and most likely you'll urinate a lot. Start cutting back on salty foods. It is extremely important that you get your blood pressure under control. Elevated blood pressure untreated can lead to heart attacks and strokes.

## 2016-01-13 NOTE — ED Notes (Signed)
Pt reports pain to his right upper teeth and swelling to the right side of his face that started yesterday.
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
## 2016-10-12 ENCOUNTER — Emergency Department (HOSPITAL_COMMUNITY)
Admission: EM | Admit: 2016-10-12 | Discharge: 2016-10-12 | Disposition: A | Discharge status: Home / Self Care | Payer: BLUE CROSS/BLUE SHIELD | Attending: Emergency Medicine | Admitting: Emergency Medicine

## 2016-10-12 ENCOUNTER — Emergency Department (HOSPITAL_COMMUNITY): Payer: BLUE CROSS/BLUE SHIELD

## 2016-10-12 ENCOUNTER — Encounter (HOSPITAL_COMMUNITY): Payer: Self-pay | Admitting: Emergency Medicine

## 2016-10-12 DIAGNOSIS — R072 Precordial pain: Secondary | ICD-10-CM | POA: Insufficient documentation

## 2016-10-12 DIAGNOSIS — R05 Cough: Secondary | ICD-10-CM

## 2016-10-12 DIAGNOSIS — J069 Acute upper respiratory infection, unspecified: Secondary | ICD-10-CM | POA: Diagnosis not present

## 2016-10-12 DIAGNOSIS — R062 Wheezing: Secondary | ICD-10-CM

## 2016-10-12 DIAGNOSIS — R059 Cough, unspecified: Secondary | ICD-10-CM

## 2016-10-12 DIAGNOSIS — F17210 Nicotine dependence, cigarettes, uncomplicated: Secondary | ICD-10-CM | POA: Insufficient documentation

## 2016-10-12 DIAGNOSIS — R0602 Shortness of breath: Secondary | ICD-10-CM | POA: Diagnosis not present

## 2016-10-12 LAB — CBC WITH DIFFERENTIAL/PLATELET
BASOS PCT: 0 %
Basophils Absolute: 0 10*3/uL (ref 0.0–0.1)
EOS ABS: 0.6 10*3/uL (ref 0.0–0.7)
Eosinophils Relative: 4 %
HCT: 44.6 % (ref 39.0–52.0)
Hemoglobin: 15.8 g/dL (ref 13.0–17.0)
LYMPHS ABS: 1.9 10*3/uL (ref 0.7–4.0)
Lymphocytes Relative: 13 %
MCH: 28 pg (ref 26.0–34.0)
MCHC: 35.4 g/dL (ref 30.0–36.0)
MCV: 78.9 fL (ref 78.0–100.0)
MONO ABS: 1.2 10*3/uL — AB (ref 0.1–1.0)
MONOS PCT: 8 %
Neutro Abs: 10.6 10*3/uL — ABNORMAL HIGH (ref 1.7–7.7)
Neutrophils Relative %: 75 %
Platelets: 221 10*3/uL (ref 150–400)
RBC: 5.65 MIL/uL (ref 4.22–5.81)
RDW: 13.3 % (ref 11.5–15.5)
WBC: 14.3 10*3/uL — ABNORMAL HIGH (ref 4.0–10.5)

## 2016-10-12 LAB — COMPREHENSIVE METABOLIC PANEL
ALBUMIN: 3.9 g/dL (ref 3.5–5.0)
ALK PHOS: 59 U/L (ref 38–126)
ALT: 25 U/L (ref 17–63)
ANION GAP: 8 (ref 5–15)
AST: 23 U/L (ref 15–41)
BUN: 6 mg/dL (ref 6–20)
CALCIUM: 8.9 mg/dL (ref 8.9–10.3)
CO2: 25 mmol/L (ref 22–32)
Chloride: 105 mmol/L (ref 101–111)
Creatinine, Ser: 0.92 mg/dL (ref 0.61–1.24)
GFR calc non Af Amer: >60 mL/min (ref >60)
GLUCOSE: 108 mg/dL — AB (ref 65–99)
POTASSIUM: 3.8 mmol/L (ref 3.5–5.1)
SODIUM: 138 mmol/L (ref 135–145)
TOTAL PROTEIN: 6.9 g/dL (ref 6.5–8.1)
Total Bilirubin: 0.8 mg/dL (ref 0.3–1.2)

## 2016-10-12 LAB — I-STAT TROPONIN, ED: TROPONIN I, POC: 0 ng/mL (ref 0.00–0.08)

## 2016-10-12 LAB — PROTIME-INR
INR: 1.01
Prothrombin Time: 13.3 seconds (ref 11.4–15.2)

## 2016-10-12 LAB — D-DIMER, QUANTITATIVE: D-Dimer, Quant: <0.27 ug/mL-FEU (ref 0.00–0.50)

## 2016-10-12 MED ORDER — IPRATROPIUM-ALBUTEROL 0.5-2.5 (3) MG/3ML IN SOLN
3.0000 mL | Freq: Once | RESPIRATORY_TRACT | Status: AC
  Administered 2016-10-12: 3 mL via RESPIRATORY_TRACT
  Filled 2016-10-12: qty 3

## 2016-10-12 MED ORDER — ALBUTEROL SULFATE HFA 108 (90 BASE) MCG/ACT IN AERS
2.0000 | INHALATION_SPRAY | Freq: Once | RESPIRATORY_TRACT | Status: AC
  Administered 2016-10-12: 2 via RESPIRATORY_TRACT
  Filled 2016-10-12: qty 6.7

## 2016-10-12 NOTE — ED Notes (Signed)
Patient transported to X-ray 

## 2016-10-12 NOTE — ED Provider Notes (Signed)
[facility]-EMERGENCY DEPT Provider Note   CSN: [id number] Arrival date & time: 10/12/16  0459     History   Chief Complaint Chief Complaint  Patient presents with  . Cough  . Chills  . Chest Pain    HPI Barry Novak is a 41 y.o. male with no significant past medical history who presents with rhinorrhea, cough, congestion, chills, chest pain, and shortness of breath. Patient reports that his daughter has been sick with an upper history infection recently. He says that over the last few days, he has developed runny nose and cough. He says the cough is now productive of a clear sputum. He says that when he is coughing, he has a chest discomfort. He says he has associated shortness of breath as well with his coughing. He says he has had some subjective fevers and chills as well. He says his chest pain is pleuritic. He says it is sharp and is an 8 out of 10 severity. He denies nausea, vomiting, conservation, diarrhea, or dysuria. He denies any rashes. He denies any traumas. He does report that he feels he is wheezing. He says he has remote history of wheezing as a child but has not used albuterol in a long time. He denies any other symptoms on arrival. He denies a family history of cardiac disease and is not a smoker.    The history is provided by the patient and medical records. No language interpreter was used.  Cough  This is a new problem. The current episode started more than 2 days ago. The problem occurs constantly. The problem has not changed since onset.The cough is productive of sputum. There has been no fever. Associated symptoms include chills, sweats, rhinorrhea, shortness of breath and wheezing. Pertinent negatives include no chest pain and no headaches. He has tried nothing for the symptoms. The treatment provided no relief. He is not a smoker. His past medical history does not include COPD.    No past medical history on file.  There are no active problems to display for this  patient.   Past Surgical History:  Procedure Laterality Date  . KNEE SURGERY         Home Medications    Prior to Admission medications   Medication Sig Start Date End Date Taking? Authorizing Provider  albuterol (PROVENTIL HFA;VENTOLIN HFA) 108 (90 Base) MCG/ACT inhaler Inhale 2 puffs into the lungs every 6 (six) hours as needed for wheezing or shortness of breath. 01/13/16   Tommi Rumps, PA-C  clindamycin (CLEOCIN) 150 MG capsule Take 2 capsules (300 mg total) by mouth 3 (three) times daily. 01/13/16   Tommi Rumps, PA-C  cyclobenzaprine (FLEXERIL) 5 MG tablet Take 1 tablet (5 mg total) by mouth 3 (three) times daily as needed (muscle pain). 03/20/15   Devoria Albe, MD  hydrochlorothiazide (HYDRODIURIL) 25 MG tablet Take 1 tablet (25 mg total) by mouth daily. 01/13/16   Tommi Rumps, PA-C  HYDROcodone-acetaminophen (NORCO/VICODIN) 5-325 MG tablet Take 1 tablet by mouth every 4 (four) hours as needed for moderate pain. 01/13/16   Tommi Rumps, PA-C    Family History No family history on file.  Social History Social History  Substance Use Topics  . Smoking status: Current Some Day Smoker    Types: Cigarettes  . Smokeless tobacco: Never Used  . Alcohol use No     Allergies   Patient has no known allergies.   Review of Systems Review of Systems  Constitutional: Positive for  chills, diaphoresis, fatigue and fever (subjective). Negative for appetite change.  HENT: Positive for rhinorrhea. Negative for congestion.   Eyes: Negative for visual disturbance.  Respiratory: Positive for cough, shortness of breath and wheezing. Negative for chest tightness.   Cardiovascular: Negative for chest pain, palpitations and leg swelling.  Gastrointestinal: Negative for abdominal pain, diarrhea, nausea and vomiting.  Genitourinary: Negative for dysuria.  Musculoskeletal: Negative for back pain and neck pain.  Skin: Negative for rash.  Neurological: Negative for light-headedness  and headaches.  Psychiatric/Behavioral: Negative for agitation and confusion.  All other systems reviewed and are negative.    Physical Exam Updated Vital Signs BP 157/99 (BP Location: Right Arm)   Pulse 95   Temp 98.6 F (37 C) (Oral)   Resp 16   SpO2 100%   Physical Exam  Constitutional: He is oriented to person, place, and time. He appears well-developed and well-nourished. No distress.  HENT:  Head: Normocephalic and atraumatic.  Right Ear: External ear normal.  Left Ear: External ear normal.  Nose: Rhinorrhea present.  Mouth/Throat: Oropharynx is clear and moist. No oropharyngeal exudate.  Eyes: Conjunctivae and EOM are normal. Pupils are equal, round, and reactive to light.  Neck: Normal range of motion. Neck supple.  Cardiovascular: Normal rate, regular rhythm, normal heart sounds and intact distal pulses.  Exam reveals no gallop.   Pulmonary/Chest: Effort normal. No stridor. No tachypnea. No respiratory distress. He has wheezes. He has no rhonchi. He exhibits tenderness (mild).  Abdominal: Soft. He exhibits no distension. There is no tenderness. There is no rebound and no guarding.  Musculoskeletal: He exhibits no edema or tenderness.  Neurological: He is alert and oriented to person, place, and time. He displays normal reflexes. No cranial nerve deficit. He exhibits normal muscle tone. Coordination normal.  Skin: Skin is warm. Capillary refill takes less than 2 seconds. No rash noted. He is not diaphoretic. No erythema. No pallor.     ED Treatments / Results  Labs (all labs ordered are listed, but only abnormal results are displayed) Labs Reviewed  CBC WITH DIFFERENTIAL/PLATELET - Abnormal; Notable for the following:       Result Value   WBC 14.3 (*)    Neutro Abs 10.6 (*)    Monocytes Absolute 1.2 (*)    All other components within normal limits  COMPREHENSIVE METABOLIC PANEL - Abnormal; Notable for the following:    Glucose, Bld 108 (*)    All other components  within normal limits  PROTIME-INR  D-DIMER, QUANTITATIVE (NOT AT [facility])  Rosezena Sensor, ED    EKG  EKG Interpretation  Date/Time:  Sunday October 12 2016 05:05:29 EDT Ventricular Rate:  93 PR Interval:    QRS Duration: 88 QT Interval:  344 QTC Calculation: 428 R Axis:   90 Text Interpretation:  Sinus rhythm Borderline right axis deviation ST elev, probable normal early repol pattern Baseline wander in lead(s) II III aVF V3 V6 When compared to prior, no significant changes were seen.  No STEMI Confirmed by Rush Landmark MD, CHRISTOPHER [phone]) on 10/12/2016 5:10:03 AM       Radiology Dg Chest 2 View  Result Date: 10/12/2016 CLINICAL DATA:  Dyspnea and chest pain for 3 days EXAM: CHEST  2 VIEW COMPARISON:  03/14/2013 FINDINGS: The heart size and mediastinal contours are within normal limits. Both lungs are clear. The visualized skeletal structures are unremarkable. IMPRESSION: No active cardiopulmonary disease. Electronically Signed   By: Ellery Plunk M.D.   On: 10/12/2016 06:14  Procedures Procedures (including critical care time)  Medications Ordered in ED Medications  ipratropium-albuterol (DUONEB) 0.5-2.5 (3) MG/3ML nebulizer solution 3 mL (3 mLs Nebulization Given 10/12/16 0530)  albuterol (PROVENTIL HFA;VENTOLIN HFA) 108 (90 Base) MCG/ACT inhaler 2 puff (2 puffs Inhalation Given 10/12/16 0719)     Initial Impression / Assessment and Plan / ED Course  I have reviewed the triage vital signs and the nursing notes.  Pertinent labs & imaging results that were available during my care of the patient were reviewed by me and considered in my medical decision making (see chart for details).     Joesphine Bareerrance M Challis is a 41 y.o. male with no significant past medical history who presents with rhinorrhea, cough, congestion, chills, chest pain, and shortness of breath.  History and exam are seen above.  On exam, patient has wheezing bilaterally. No focal rhonchi. Patient's chest is  slightly tender to palpation. Patient's abdomen is nontender. No lower extremity edema or swelling. Visible rhinorrhea and audible congestion appreciated.  Laboratory testing showed a negative troponin. Negative d-dimer. Patient has a small leukocytosis of 14.3. No anemia. CMP unremarkable.  EKG showed no acute changes.  Chest x-ray shows no evidence of pneumonia.  Patient felt much better after DuoNeb breathing treatment. Suspect a viral upper history infection exacerbating underlying reactive airway disease. With his negative troponin and negative d-dimer and similar EKG, do not feel patient is having cardiac chest pain. Do not feel patient has a PE. Suspect a musculoskeletal type chest pain in the setting of his coughing and wheezing.  Patient given instructions on conservative management as well as PCP follow-up and patient also given albuterol inhaler with resolution and wheezing. Patient understood return precautions for any new or worsening symptoms.  Patient had no other questions or concerns and patient was discharged in good condition with significant improvement in his presenting symptoms.    Final Clinical Impressions(s) / ED Diagnoses   Final diagnoses:  Viral upper respiratory tract infection  Cough  Precordial chest pain  Shortness of breath  Wheezing    New Prescriptions Discharge Medication List as of 10/12/2016  7:10 AM      Clinical Impression: 1. Viral upper respiratory tract infection   2. Cough   3. Precordial chest pain   4. Shortness of breath   5. Wheezing     Disposition: Discharge  Condition: Good  I have discussed the results, Dx and Tx plan with the pt(& family if present). He/she/they expressed understanding and agree(s) with the plan. Discharge instructions discussed at great length. Strict return precautions discussed and pt &/or family have verbalized understanding of the instructions. No further questions at time of discharge.     Discharge Medication List as of 10/12/2016  7:10 AM      [facility] [address] [place] [id number] [phone]    [facility] [address] [id number] [address] [id number] [phone]  If symptoms worsen     Heide Scaleshristopher J Tegeler, MD 10/12/16 [phone]

## 2016-10-12 NOTE — ED Notes (Signed)
ED Provider at bedside. 

## 2016-10-12 NOTE — Discharge Instructions (Signed)
Please schedule an appointment to follow-up with a primary care physician for ongoing management of your shortness of breath and wheezing. Please stay hydrated. You likely have a viral upper respiratory infection causing her symptoms. We did not find evidence of pneumonia, lung disease, or heart disease today. If any symptoms acutely worsen, please return to the nearest emergency room.

## 2016-10-12 NOTE — ED Triage Notes (Signed)
Pt presents to the ED for chest pain worsening with deep breaths and coughing, as well as cough, and chills, and shortness of breath. Symptoms started Friday. Pt states he was taking otc medications but nothing has helped.

## 2016-10-15 ENCOUNTER — Emergency Department (HOSPITAL_COMMUNITY)
Admission: EM | Admit: 2016-10-15 | Discharge: 2016-10-15 | Disposition: A | Discharge status: Home / Self Care | Payer: BLUE CROSS/BLUE SHIELD | Attending: Emergency Medicine | Admitting: Emergency Medicine

## 2016-10-15 ENCOUNTER — Encounter (HOSPITAL_COMMUNITY): Payer: Self-pay

## 2016-10-15 ENCOUNTER — Emergency Department (HOSPITAL_COMMUNITY): Payer: BLUE CROSS/BLUE SHIELD

## 2016-10-15 DIAGNOSIS — B349 Viral infection, unspecified: Secondary | ICD-10-CM | POA: Diagnosis not present

## 2016-10-15 DIAGNOSIS — J45901 Unspecified asthma with (acute) exacerbation: Secondary | ICD-10-CM | POA: Insufficient documentation

## 2016-10-15 DIAGNOSIS — R059 Cough, unspecified: Secondary | ICD-10-CM

## 2016-10-15 DIAGNOSIS — Z79899 Other long term (current) drug therapy: Secondary | ICD-10-CM | POA: Insufficient documentation

## 2016-10-15 DIAGNOSIS — F17210 Nicotine dependence, cigarettes, uncomplicated: Secondary | ICD-10-CM | POA: Insufficient documentation

## 2016-10-15 DIAGNOSIS — R072 Precordial pain: Secondary | ICD-10-CM | POA: Diagnosis not present

## 2016-10-15 DIAGNOSIS — R05 Cough: Secondary | ICD-10-CM

## 2016-10-15 DIAGNOSIS — R0602 Shortness of breath: Secondary | ICD-10-CM | POA: Diagnosis present

## 2016-10-15 MED ORDER — PREDNISONE 50 MG PO TABS: 50.0000 mg | ORAL_TABLET | Freq: Every day | ORAL | 0 refills | Status: AC

## 2016-10-15 MED ORDER — HYDROCODONE-ACETAMINOPHEN 5-325 MG PO TABS: 1.0000 | ORAL_TABLET | Freq: Four times a day (QID) | ORAL | 0 refills | Status: DC | PRN

## 2016-10-15 MED ORDER — IPRATROPIUM-ALBUTEROL 0.5-2.5 (3) MG/3ML IN SOLN
3.0000 mL | Freq: Once | RESPIRATORY_TRACT | Status: AC
  Administered 2016-10-15: 3 mL via RESPIRATORY_TRACT
  Filled 2016-10-15: qty 3

## 2016-10-15 MED ORDER — METHYLPREDNISOLONE SODIUM SUCC 125 MG IJ SOLR
125.0000 mg | Freq: Once | INTRAMUSCULAR | Status: AC
  Administered 2016-10-15: 125 mg via INTRAVENOUS
  Filled 2016-10-15: qty 2

## 2016-10-15 MED ORDER — SODIUM CHLORIDE 0.9 % IV BOLUS (SEPSIS)
1000.0000 mL | Freq: Once | INTRAVENOUS | Status: AC
  Administered 2016-10-15: 1000 mL via INTRAVENOUS

## 2016-10-15 MED ORDER — CYCLOBENZAPRINE HCL 10 MG PO TABS: 10.0000 mg | ORAL_TABLET | Freq: Two times a day (BID) | ORAL | 0 refills | Status: DC | PRN

## 2016-10-15 MED ORDER — CYCLOBENZAPRINE HCL 10 MG PO TABS
10.0000 mg | ORAL_TABLET | Freq: Once | ORAL | Status: AC
  Administered 2016-10-15: 10 mg via ORAL
  Filled 2016-10-15: qty 1

## 2016-10-15 NOTE — ED Notes (Signed)
Pt states improvement of chest pain after breathing tx.

## 2016-10-15 NOTE — ED Provider Notes (Signed)
[facility]PT Provider Note   CSN: [id number] Arrival date & time: 10/15/16  1126  By signing my name below, I, Rosario AdieWilliam Andrew Hiatt, attest that this documentation has been prepared under the direction and in the presence of Heide Scaleshristopher J Marzelle Rutten, MD. Electronically Signed: Rosario AdieWilliam Andrew Hiatt, ED Scribe. 10/15/16. 12:35 PM.  History   Chief Complaint Chief Complaint  Patient presents with  . Generalized Body Aches  . Shortness of Breath   The history is provided by the patient and medical records. No language interpreter was used.  Shortness of Breath  This is a recurrent problem. The average episode lasts 1 week. The problem occurs frequently.The current episode started more than 1 week ago. Associated symptoms include a fever, cough, sputum production and chest pain. Pertinent negatives include no headaches, no rhinorrhea, no wheezing, no vomiting, no abdominal pain, no rash, no leg pain and no leg swelling. It is unknown what precipitated the problem. He has tried beta-agonist inhalers for the symptoms. The treatment provided moderate relief. He has had no prior hospitalizations. He has had prior ED visits. He has had no prior ICU admissions. Associated medical issues do not include asthma.    HPI Comments: Barry Novak is a 41 y.o. male with no pertinent PMHx, who presents to the Emergency Department complaining of persistent productive with clear sputum, fatigue, generalized myalgias, fever, chills, chest pain and shortness of breath beginning approximately one week ago. Per prior chart review, pt was seen in the ED for same on 10/12/16 (~3 days ago). At that time he had workup including CXR, EKG tracing, negative troponin/d-dimer, unremarkable CMP. Pain was ruled as MSK at that time and he was given DuoNeb treatments while in the ED with improvement of his SOB. 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 Since this, his symptoms have been worsening again. He has also been using his inhaler from last being d/c'd with temporary relief of his shortness of breath. He has used this twice today prior to coming into the ED. Pt has had decreased PO intake secondary to loss of appetite since the onset of his symptoms. No recent injury or trauma. He denies nausea, vomiting, abdominal pain, constipation, diarrhea, rash, leg swelling, urgency, frequency, hematuria, dysuria, difficulty urinating, or any other associated symptoms.   History reviewed. No pertinent past medical history.  There are no active problems to display for this patient.  Past Surgical History:  Procedure Laterality Date  . KNEE SURGERY      Home Medications    Prior to Admission medications   Medication Sig Start Date End Date Taking? Authorizing Provider  albuterol (PROVENTIL HFA;VENTOLIN HFA) 108 (90 Base) MCG/ACT inhaler Inhale 2 puffs into the lungs every 6 (six) hours as needed for wheezing or shortness of breath. Patient not taking: Reported on 10/12/2016 01/13/16   Tommi Rumpshonda L Summers, PA-C  clindamycin (CLEOCIN) 150 MG capsule Take 2 capsules (300 mg total) by mouth 3 (three) times daily. Patient not taking: Reported on 10/12/2016 01/13/16   Tommi Rumpshonda L Summers, PA-C  cyclobenzaprine (FLEXERIL) 5 MG tablet Take 1 tablet (5 mg total) by mouth 3 (three) times daily as needed (muscle pain). Patient not taking: Reported on 10/12/2016 03/20/15   Devoria AlbeIva Knapp, MD  hydrochlorothiazide (HYDRODIURIL) 25 MG tablet Take 1 tablet (  25 mg total) by mouth daily. Patient not taking: Reported on 10/12/2016 01/13/16   Tommi Rumps, PA-C  HYDROcodone-acetaminophen (NORCO/VICODIN) 5-325 MG tablet Take 1 tablet by mouth every 4 (four) hours as needed for moderate pain. Patient not taking: Reported on 10/12/2016  01/13/16   Tommi Rumps, PA-C   Family History Family History  Problem Relation Age of Onset  . Hypertension Mother   . Diabetes Mother   . Hypertension Father   . Diabetes Sister    Social History Social History  Substance Use Topics  . Smoking status: Current Some Day Smoker    Types: Cigarettes  . Smokeless tobacco: Never Used  . Alcohol use No   Allergies   Patient has no known allergies.  Review of Systems Review of Systems  Constitutional: Positive for appetite change, chills, diaphoresis, fatigue and fever. Negative for activity change.  HENT: Negative for congestion and rhinorrhea.   Eyes: Negative for visual disturbance.  Respiratory: Positive for cough, sputum production and shortness of breath. Negative for chest tightness, wheezing and stridor.   Cardiovascular: Positive for chest pain. Negative for palpitations and leg swelling.  Gastrointestinal: Negative for abdominal distention, abdominal pain, blood in stool, constipation, diarrhea, nausea and vomiting.  Genitourinary: Negative for difficulty urinating, dysuria, flank pain, frequency, hematuria and urgency.  Musculoskeletal: Positive for myalgias (generalized). Negative for back pain and gait problem.  Skin: Negative for rash and wound.  Neurological: Negative for dizziness, weakness, light-headedness and headaches.  Psychiatric/Behavioral: Negative for agitation.  All other systems reviewed and are negative.  Physical Exam Updated Vital Signs BP (!) 165/101 (BP Location: Left Arm)   Pulse 84   Temp 99.4 F (37.4 C) (Oral)   Resp 18   SpO2 98%   Physical Exam  Constitutional: He is oriented to person, place, and time. He appears well-developed and well-nourished. No distress.  HENT:  Head: Normocephalic and atraumatic.  Right Ear: External ear normal.  Left Ear: External ear normal.  Nose: Nose normal.  Mouth/Throat: Oropharynx is clear and moist. No oropharyngeal exudate.  Eyes: Conjunctivae  and EOM are normal. Pupils are equal, round, and reactive to light.  Neck: Normal range of motion. Neck supple.  Cardiovascular: Normal rate, regular rhythm and normal heart sounds.   No murmur heard. Pulmonary/Chest: Effort normal. No stridor. No respiratory distress. He has wheezes. He has no rales. He exhibits tenderness.  Wheezing in all lung fields. Slightly tender chest. Audible congestion.    Abdominal: Soft. There is no tenderness. There is no rebound and no guarding.  Musculoskeletal: He exhibits no edema.  No BLE edema.   Neurological: He is alert and oriented to person, place, and time. He displays normal reflexes. No cranial nerve deficit. He exhibits normal muscle tone. Coordination normal.  Intact neurological exam.   Skin: Skin is warm. No rash noted. He is not diaphoretic. No erythema.   ED Treatments / Results  DIAGNOSTIC STUDIES: Oxygen Saturation is 98% on RA, normal by my interpretation.   COORDINATION OF CARE: 12:35 PM-Discussed next steps with pt. Pt verbalized understanding and is agreeable with the plan.   Labs (all labs ordered are listed, but only abnormal results are displayed) Labs Reviewed - No data to display  EKG  EKG Interpretation  Date/Time:  Wednesday October 15 2016 11:33:17 EDT Ventricular Rate:  93 PR Interval:  178 QRS Duration: 84 QT Interval:  344 QTC Calculation: 427 R Axis:   94 Text Interpretation:  Normal sinus rhythm Rightward axis  Borderline ECG When compared to prior, no significant changes seen.  No STEMI Confirmed by [facility] MD, Estle Sabella [phone]) on 10/15/2016 12:24:42 PM      Radiology Dg Chest 2 View  Result Date: 10/15/2016 CLINICAL DATA:  Shortness of breath and body aches EXAM: CHEST  2 VIEW COMPARISON:  10/12/2016 FINDINGS: Normal heart size and mediastinal contours. No acute infiltrate or edema. No effusion or pneumothorax. No acute osseous findings. IMPRESSION: Negative chest. Electronically Signed   By: Marnee Spring M.D.   On: 10/15/2016 12:24   Procedures Procedures   Medications Ordered in ED Medications  methylPREDNISolone sodium succinate (SOLU-MEDROL) 125 mg/2 mL injection 125 mg (125 mg Intravenous Given 10/15/16 1246)  ipratropium-albuterol (DUONEB) 0.5-2.5 (3) MG/3ML nebulizer solution 3 mL (3 mLs Nebulization Given 10/15/16 1247)  cyclobenzaprine (FLEXERIL) tablet 10 mg (10 mg Oral Given 10/15/16 1246)  sodium chloride 0.9 % bolus 1,000 mL (0 mLs Intravenous Stopped 10/15/16 1512)    Initial Impression / Assessment and Plan / ED Course  I have reviewed the triage vital signs and the nursing notes.  Pertinent labs & imaging results that were available during my care of the patient were reviewed by me and considered in my medical decision making (see chart for details).     Barry Novak is a 41 y.o. male with no pertinent PMHx, who presents to the Emergency Department complaining of persistent productive with clear sputum, fatigue, generalized myalgias, fever, chills, chest pain and shortness of breath beginning approximately one week ago.   Patient was seen by me several days ago. At that time, patient had EKG that showed no ischemia, d-dimer negative, troponin negative, and chest x-ray showing no pneumonia. Patient had wheezing and was given a breathing treatment with resolution of wheezing and discomfort. Patient discharged home with a albuterol inhaler and instructions to follow-up.  Patient reports that after going home, he started feeling better but then today at work, had continuing symptoms. He reported continued malaise, some chills, and his productive cough. Patient is a chest pain has improved but he still has some chest tenderness.   On exam, patient has wheezing in all lung fields. Patient continues to have some congestion. Patient's chest was slightly tender to palpation but otherwise his exam was unremarkable.  Patient will be given Solu-Medrol and a DuoNeb breathing  treatment. Patient says he has not been eating and drinking due to decreased appetite. Patient will be given fluids. Patient also given a Flexeril to help with his likely muscular skeletal chest pain. Patient will have a chest x-ray to reassess for possible pneumonia. Patient had screening EKG in triage due to the complaint of chest pain however, it was unchanged from prior.   Chest x-ray shows no pneumonia.  Anticipate reassessment following therapies.  Patient had resolution of his wheezing after steroids and breathing treatment. Patient felt much better with his chest pain after the pain medicine and muscle relaxant. Patient had no persistent symptoms and felt much better.   Patient's lungs were clear with no wheezing.   Patient was given prescription for steroids for the next several days and also be given pain medicine and nausea medicine to go home with. Patient will be encouraged to continue taking his albuterol as prescription prescribed. Patient will follow-up with a PCP for further management of his likely underlying reactive airway disease. Patient had no other questions or concerns and was discharged in good condition with improvement in his presenting symptoms.   Final Clinical Impressions(s) /  ED Diagnoses   Final diagnoses:  Viral syndrome  Cough  Mild asthma with exacerbation, unspecified whether persistent  Precordial pain   New Prescriptions New Prescriptions   CYCLOBENZAPRINE (FLEXERIL) 10 MG TABLET    Take 1 tablet (10 mg total) by mouth 2 (two) times daily as needed for muscle spasms.   HYDROCODONE-ACETAMINOPHEN (NORCO) 5-325 MG TABLET    Take 1 tablet by mouth every 6 (six) hours as needed for moderate pain.   PREDNISONE (DELTASONE) 50 MG TABLET    Take 1 tablet (50 mg total) by mouth daily.   I personally performed the services described in this documentation, which was scribed in my presence. The recorded information has been reviewed and is accurate.   Clinical  Impression: 1. Viral syndrome   2. Cough   3. Mild asthma with exacerbation, unspecified whether persistent   4. Precordial pain     Disposition: Discharge  Condition: Good  I have discussed the results, Dx and Tx plan with the pt(& family if present). He/she/they expressed understanding and agree(s) with the plan. Discharge instructions discussed at great length. Strict return precautions discussed and pt &/or family have verbalized understanding of the instructions. No further questions at time of discharge.    New Prescriptions   CYCLOBENZAPRINE (FLEXERIL) 10 MG TABLET    Take 1 tablet (10 mg total) by mouth 2 (two) times daily as needed for muscle spasms.   HYDROCODONE-ACETAMINOPHEN (NORCO) 5-325 MG TABLET    Take 1 tablet by mouth every 6 (six) hours as needed for moderate pain.   PREDNISONE (DELTASONE) 50 MG TABLET    Take 1 tablet (50 mg total) by mouth daily.    Follow Up: [facility] [address] [place] [address] [phone] Schedule an appointment as soon as possible for a visit    [facility] EMERGENCY DE[facility] [address] [phone] [place] [address] [phone]  If symptoms worsen       Heide Scales, MD 10/15/16 1821

## 2016-10-15 NOTE — ED Triage Notes (Signed)
Pt reports he was seen here for URI last week with no improvement. He reports continued chest pain and shortness of breath. No distress noted. Pt also reports persistent cough.

## 2016-10-15 NOTE — ED Notes (Signed)
Pt to radiology.

## 2016-10-15 NOTE — Discharge Instructions (Signed)
Please take your steroids for the next several days for your asthma attack. Please use the pain medicine and muscle relaxant to help your likely musculoskeletal chest wall pain. Please schedule an appointment to follow-up with a primary care physician. If any symptoms return or worsen, please return to the nearest emergency department.

## 2018-01-10 ENCOUNTER — Emergency Department
Admission: EM | Admit: 2018-01-10 | Discharge: 2018-01-10 | Disposition: A | Discharge status: Home / Self Care | Payer: BLUE CROSS/BLUE SHIELD | Attending: Emergency Medicine | Admitting: Emergency Medicine

## 2018-01-10 ENCOUNTER — Other Ambulatory Visit: Payer: Self-pay

## 2018-01-10 ENCOUNTER — Encounter: Payer: Self-pay | Admitting: Emergency Medicine

## 2018-01-10 DIAGNOSIS — I10 Essential (primary) hypertension: Secondary | ICD-10-CM | POA: Diagnosis not present

## 2018-01-10 DIAGNOSIS — R51 Headache: Secondary | ICD-10-CM | POA: Diagnosis present

## 2018-01-10 DIAGNOSIS — F17210 Nicotine dependence, cigarettes, uncomplicated: Secondary | ICD-10-CM | POA: Diagnosis not present

## 2018-01-10 HISTORY — DX: Essential (primary) hypertension: I10

## 2018-01-10 MED ORDER — HYDROCHLOROTHIAZIDE 12.5 MG PO CAPS: 12.5000 mg | ORAL_CAPSULE | Freq: Every day | ORAL | 1 refills | Status: DC

## 2018-01-10 MED ORDER — BUTALBITAL-APAP-CAFFEINE 50-325-40 MG PO TABS
1.0000 | ORAL_TABLET | Freq: Once | ORAL | Status: AC
  Administered 2018-01-10: 1 via ORAL
  Filled 2018-01-10: qty 1

## 2018-01-10 MED ORDER — HYDROCHLOROTHIAZIDE 25 MG PO TABS
12.5000 mg | ORAL_TABLET | Freq: Once | ORAL | Status: AC
  Administered 2018-01-10: 12.5 mg via ORAL
  Filled 2018-01-10: qty 1

## 2018-01-10 NOTE — Discharge Instructions (Addendum)
Please seek medical attention for any high fevers, chest pain, shortness of breath, change in behavior, persistent vomiting, bloody stool or any other new or concerning symptoms.  

## 2018-01-10 NOTE — ED Provider Notes (Signed)
[facility] Emergency Department Provider Note   ____________________________________________   I have reviewed the triage vital signs and the nursing notes.   HISTORY  Chief Complaint Migraine and Hypertension   History limited by: Not Limited   HPI Barry Novak is a 42 y.o. male who presents to the emergency department today because of concerns for high blood pressure and headache.  Patient states he has had high blood pressure for "a minute."  He has been keeping a closer eye on it over the past couple of weeks.  He is noticed that when he gets eye has associated headache.  Is located on the right side of his head.  States blood pressure does run in his family.  He had been put on blood pressure medication 1 time but stopped following up with a doctor and thus is no longer on blood pressure medication.  He denies any chest pain.   Per medical record review patient has a history of being on hydrochlorothiazide in the past.   History reviewed. No pertinent past medical history.  There are no active problems to display for this patient.   Past Surgical History:  Procedure Laterality Date  . KNEE SURGERY      Prior to Admission medications   Medication Sig Start Date End Date Taking? Authorizing Provider  albuterol (PROVENTIL HFA;VENTOLIN HFA) 108 (90 Base) MCG/ACT inhaler Inhale 2 puffs into the lungs every 6 (six) hours as needed for wheezing or shortness of breath. Patient not taking: Reported on 10/12/2016 01/13/16   Tommi Rumps, PA-C  clindamycin (CLEOCIN) 150 MG capsule Take 2 capsules (300 mg total) by mouth 3 (three) times daily. Patient not taking: Reported on 10/12/2016 01/13/16   Tommi Rumps, PA-C  cyclobenzaprine (FLEXERIL) 10 MG tablet Take 1 tablet (10 mg total) by mouth 2 (two) times daily as needed for muscle spasms. 10/15/16   Tegeler, Canary Brim, MD  cyclobenzaprine (FLEXERIL) 5 MG tablet Take 1 tablet (5 mg total) by  mouth 3 (three) times daily as needed (muscle pain). Patient not taking: Reported on 10/12/2016 03/20/15   Devoria Albe, MD  hydrochlorothiazide (HYDRODIURIL) 25 MG tablet Take 1 tablet (25 mg total) by mouth daily. Patient not taking: Reported on 10/12/2016 01/13/16   Tommi Rumps, PA-C  HYDROcodone-acetaminophen [facility]) 5-325 MG tablet Take 1 tablet by mouth every 6 (six) hours as needed for moderate pain. 10/15/16   Tegeler, Canary Brim, MD  HYDROcodone-acetaminophen (NORCO/VICODIN) 5-325 MG tablet Take 1 tablet by mouth every 4 (four) hours as needed for moderate pain. Patient not taking: Reported on 10/12/2016 01/13/16   Tommi Rumps, PA-C    Allergies Patient has no known allergies.  Family History  Problem Relation Age of Onset  . Hypertension Mother   . Diabetes Mother   . Hypertension Father   . Diabetes Sister     Social History Social History   Tobacco Use  . Smoking status: Current Some Day Smoker    Types: Cigarettes  . Smokeless tobacco: Never Used  Substance Use Topics  . Alcohol use: No  . Drug use: No    Review of Systems Constitutional: No fever/chills Eyes: No visual changes. ENT: No sore throat. Cardiovascular: Denies chest pain. Respiratory: Denies shortness of breath. Gastrointestinal: No abdominal pain.  No nausea, no vomiting.  No diarrhea.   Genitourinary: Negative for dysuria. Musculoskeletal: Negative for back pain. Skin: Negative for rash. Neurological: Positive for headache.  ____________________________________________   PHYSICAL EXAM:  VITAL SIGNS: ED Triage Vitals  Enc Vitals Group     BP 01/10/18 0913 (!) 155/108     Pulse Rate 01/10/18 0913 82     Resp 01/10/18 0913 16     Temp 01/10/18 0913 98.5 F (36.9 C)     Temp Source 01/10/18 0913 Oral     SpO2 01/10/18 0913 97 %     Weight 01/10/18 0914 210 lb (95.3 kg)     Height 01/10/18 0914 5\' 11"  (1.803 m)     Head Circumference --      Peak Flow --      Pain Score  01/10/18 0913 8   Constitutional: Alert and oriented.  Eyes: Conjunctivae are normal.  ENT      Head: Normocephalic and atraumatic.      Nose: No congestion/rhinnorhea.      Mouth/Throat: Mucous membranes are moist.      Neck: No stridor. Hematological/Lymphatic/Immunilogical: No cervical lymphadenopathy. Cardiovascular: Normal rate, regular rhythm.  No murmurs, rubs, or gallops.  Respiratory: Normal respiratory effort without tachypnea nor retractions. Breath sounds are clear and equal bilaterally. No wheezes/rales/rhonchi. Gastrointestinal: Soft and non tender. No rebound. No guarding.  Genitourinary: Deferred Musculoskeletal: Normal range of motion in all extremities. No lower extremity edema. Neurologic:  Normal speech and language. No gross focal neurologic deficits are appreciated.  Skin:  Skin is warm, dry and intact. No rash noted. Psychiatric: Mood and affect are normal. Speech and behavior are normal. Patient exhibits appropriate insight and judgment.  ____________________________________________    LABS (pertinent positives/negatives)  None  ____________________________________________   EKG  None  ____________________________________________    RADIOLOGY  None  ____________________________________________   PROCEDURES  Procedures  ____________________________________________   INITIAL IMPRESSION / ASSESSMENT AND PLAN / ED COURSE  Pertinent labs & imaging results that were available during my care of the patient were reviewed by me and considered in my medical decision making (see chart for details).   Patient presented to the emergency department today because of concerns for headache and high blood pressure.  The patient does have a history of high blood pressure had been on hydrochlorothiazide in the past.  The patient did feel better after medication here in the emergency department. Will discharge home with blood pressure medication. Patient  states he already has appointment scheduled next month with primary care.   ____________________________________________   FINAL CLINICAL IMPRESSION(S) / ED DIAGNOSES  Final diagnoses:  Hypertension, unspecified type     Note: This dictation was prepared with Dragon dictation. Any transcriptional errors that result from this process are unintentional     Phineas SemenGoodman, Oneill Bais, MD 01/10/18 1137

## 2018-01-10 NOTE — ED Notes (Signed)
First Nurse Note: Pt to ED c/o HTN and headache. Pt states BP this morning was 183/114. Pt in NAD at this time.

## 2018-01-10 NOTE — ED Notes (Signed)
Pt states a month ago was at dentist and was told BP was elevated. States he made a doctors appt for BP. Does not take any medicine for BP. States HA at temporals and neck pain. Alert, oriented, ambulatory. No distress noted. Able to move all extremities. No neuro deficits noted. Denies blurred vision.

## 2018-01-10 NOTE — ED Triage Notes (Signed)
C/O migraine headache and elevated blood pressure for the past 3-4 days.  States has no history of HTN.  States BP has been 163/104 - last night.  C/O headache to fore head and pain radiates down right side of head to neck.

## 2018-01-10 NOTE — ED Notes (Signed)
Pt states headache is gone and he feels much better. Has dr appt on the 1st - had gone off his bp meds.

## 2018-04-19 ENCOUNTER — Other Ambulatory Visit: Payer: Self-pay

## 2018-04-19 ENCOUNTER — Emergency Department
Admission: EM | Admit: 2018-04-19 | Discharge: 2018-04-19 | Disposition: A | Discharge status: Home / Self Care | Payer: BLUE CROSS/BLUE SHIELD | Attending: Emergency Medicine | Admitting: Emergency Medicine

## 2018-04-19 ENCOUNTER — Emergency Department: Payer: BLUE CROSS/BLUE SHIELD

## 2018-04-19 DIAGNOSIS — Z87891 Personal history of nicotine dependence: Secondary | ICD-10-CM | POA: Insufficient documentation

## 2018-04-19 DIAGNOSIS — R05 Cough: Secondary | ICD-10-CM | POA: Diagnosis present

## 2018-04-19 DIAGNOSIS — Z79899 Other long term (current) drug therapy: Secondary | ICD-10-CM | POA: Diagnosis not present

## 2018-04-19 DIAGNOSIS — J40 Bronchitis, not specified as acute or chronic: Secondary | ICD-10-CM | POA: Diagnosis not present

## 2018-04-19 DIAGNOSIS — I10 Essential (primary) hypertension: Secondary | ICD-10-CM | POA: Insufficient documentation

## 2018-04-19 MED ORDER — AZITHROMYCIN 250 MG PO TABS: ORAL_TABLET | ORAL | 0 refills | Status: AC

## 2018-04-19 MED ORDER — IBUPROFEN 800 MG PO TABS: 800.0000 mg | ORAL_TABLET | Freq: Three times a day (TID) | ORAL | 0 refills | Status: AC | PRN

## 2018-04-19 MED ORDER — PSEUDOEPH-BROMPHEN-DM 30-2-10 MG/5ML PO SYRP: 5.0000 mL | ORAL_SOLUTION | Freq: Four times a day (QID) | ORAL | 0 refills | Status: DC | PRN

## 2018-04-19 NOTE — ED Provider Notes (Signed)
[facility] Emergency Department Provider Note   ____________________________________________   First MD Initiated Contact with Patient 04/19/18 1302     (approximate)  I have reviewed the triage vital signs and the nursing notes.   HISTORY  Chief Complaint URI   HPI Barry Novak is a 42 y.o. male patient complain of sinus and chest congestion.  Patient also state of productive cough.  Patient denies nausea, vomiting, diarrhea.  Patient had body aches and pains.  Patient stated no relief taking over-the-counter cough preparations.  Patient rates his pain discomfort as 8/10.  Patient describes his pain as "aching".  Past Medical History:  Diagnosis Date  . Hypertension     There are no active problems to display for this patient.   Past Surgical History:  Procedure Laterality Date  . KNEE SURGERY      Prior to Admission medications   Medication Sig Start Date End Date Taking? Authorizing Provider  albuterol (PROVENTIL HFA;VENTOLIN HFA) 108 (90 Base) MCG/ACT inhaler Inhale 2 puffs into the lungs every 6 (six) hours as needed for wheezing or shortness of breath. Patient not taking: Reported on 10/12/2016 01/13/16   Tommi Rumps, PA-C  azithromycin (ZITHROMAX Z-PAK) 250 MG tablet Take 2 tablets (500 mg) on  Day 1,  followed by 1 tablet (250 mg) once daily on Days 2 through 5. 04/19/18 04/24/18  Joni Reining, PA-C  brompheniramine-pseudoephedrine-DM 30-2-10 MG/5ML syrup Take 5 mLs by mouth 4 (four) times daily as needed. 04/19/18   Joni Reining, PA-C  clindamycin (CLEOCIN) 150 MG capsule Take 2 capsules (300 mg total) by mouth 3 (three) times daily. Patient not taking: Reported on 10/12/2016 01/13/16   Tommi Rumps, PA-C  cyclobenzaprine (FLEXERIL) 10 MG tablet Take 1 tablet (10 mg total) by mouth 2 (two) times daily as needed for muscle spasms. 10/15/16   Tegeler, Canary Brim, MD  cyclobenzaprine (FLEXERIL) 5 MG tablet Take 1 tablet (5  mg total) by mouth 3 (three) times daily as needed (muscle pain). Patient not taking: Reported on 10/12/2016 03/20/15   Devoria Albe, MD  hydrochlorothiazide (HYDRODIURIL) 25 MG tablet Take 1 tablet (25 mg total) by mouth daily. Patient not taking: Reported on 10/12/2016 01/13/16   Tommi Rumps, PA-C  hydrochlorothiazide (MICROZIDE) 12.5 MG capsule Take 1 capsule (12.5 mg total) by mouth daily. 01/10/18 01/10/19  Phineas Semen, MD  HYDROcodone-acetaminophen (NORCO) 5-325 MG tablet Take 1 tablet by mouth every 6 (six) hours as needed for moderate pain. 10/15/16   Tegeler, Canary Brim, MD  HYDROcodone-acetaminophen (NORCO/VICODIN) 5-325 MG tablet Take 1 tablet by mouth every 4 (four) hours as needed for moderate pain. Patient not taking: Reported on 10/12/2016 01/13/16   Tommi Rumps, PA-C  ibuprofen (ADVIL,MOTRIN) 800 MG tablet Take 1 tablet (800 mg total) by mouth every 8 (eight) hours as needed for moderate pain. 04/19/18   Joni Reining, PA-C    Allergies Tomato  Family History  Problem Relation Age of Onset  . Hypertension Mother   . Diabetes Mother   . Hypertension Father   . Diabetes Sister     Social History Social History   Tobacco Use  . Smoking status: Former Smoker    Types: Cigarettes  . Smokeless tobacco: Never Used  Substance Use Topics  . Alcohol use: No  . Drug use: No    Review of Systems Constitutional: No fever/chills Eyes: No visual changes. ENT: Nasal congestion.   Cardiovascular: Denies chest pain. Respiratory:  Chest congestion and productive cough. Gastrointestinal: No abdominal pain.  No nausea, no vomiting.  No diarrhea.  No constipation. Genitourinary: Negative for dysuria. Musculoskeletal: Negative for back pain. Skin: Negative for rash. Neurological: Negative for headaches, focal weakness or numbness. Endocrine:Hypertension. Allergic/Immunilogical: Tomatoes ____________________________________________   PHYSICAL EXAM:  VITAL  SIGNS: ED Triage Vitals  Enc Vitals Group     BP 04/19/18 1243 (!) 154/101     Pulse Rate 04/19/18 1243 96     Resp 04/19/18 1243 18     Temp 04/19/18 1243 98.9 F (37.2 C)     Temp Source 04/19/18 1243 Oral     SpO2 04/19/18 1243 99 %     Weight 04/19/18 1244 210 lb (95.3 kg)     Height 04/19/18 1244 5\' 11"  (1.803 m)     Head Circumference --      Peak Flow --      Pain Score 04/19/18 1244 8     Pain Loc --      Pain Edu? --      Excl. in GC? --    Constitutional: Alert and oriented. Well appearing and in no acute distress. Eyes: Conjunctivae are normal. PERRL. EOMI. Head: Atraumatic. Nose: Edematous nasal turbinates with thick rhinorrhea. Mouth/Throat: Mucous membranes are moist.  Oropharynx non-erythematous.  Postnasal drainage.   Neck: No stridor. Cardiovascular: Normal rate, regular rhythm. Grossly normal heart sounds.  Good peripheral circulation.  Elevated blood pressure. Respiratory: Normal respiratory effort.  No retractions. Lungs CTAB. Neurologic:  Normal speech and language. No gross focal neurologic deficits are appreciated. No gait instability. Skin:  Skin is warm, dry and intact. No rash noted. Psychiatric: Mood and affect are normal. Speech and behavior are normal.  ____________________________________________   LABS (all labs ordered are listed, but only abnormal results are displayed)  Labs Reviewed - No data to display ____________________________________________  EKG   ____________________________________________  RADIOLOGY  ED MD interpretation:    Official radiology report(s): Dg Chest 2 View  Result Date: 04/19/2018 CLINICAL DATA:  Cough for 5 days.  Rales on exam.  Former smoker. EXAM: CHEST - 2 VIEW COMPARISON:  10/15/2016 FINDINGS: The heart size and mediastinal contours are within normal limits. Both lungs are clear. The visualized skeletal structures are unremarkable. IMPRESSION: Negative.  No active cardiopulmonary disease.  Electronically Signed   By: Myles Rosenthal M.D.   On: 04/19/2018 13:49    ____________________________________________   PROCEDURES  Procedure(s) performed: None  Procedures  Critical Care performed: No  ____________________________________________   INITIAL IMPRESSION / ASSESSMENT AND PLAN / ED COURSE  As part of my medical decision making, I reviewed the following data within the electronic MEDICAL RECORD NUMBER    Patient presents with 5 days of sinus and chest congestion.  Patient also has productive cough.  Discussed patient chest x-ray results.  Patient given discharge care instructions for bronchitis.  Patient advised take medication as directed and follow-up PCP if condition persist.  Patient given a work note.      ____________________________________________   FINAL CLINICAL IMPRESSION(S) / ED DIAGNOSES  Final diagnoses:  Bronchitis     ED Discharge Orders         Ordered    azithromycin (ZITHROMAX Z-PAK) 250 MG tablet     04/19/18 1432    brompheniramine-pseudoephedrine-DM 30-2-10 MG/5ML syrup  4 times daily PRN     04/19/18 1432    ibuprofen (ADVIL,MOTRIN) 800 MG tablet  Every 8 hours PRN     04/19/18 1432  Note:  This document was prepared using Dragon voice recognition software and may include unintentional dictation errors.    Joni ReiningSmith, Mohamed Portlock K, PA-C 04/19/18 1438    Jene EveryKinner, Robert, MD 04/19/18 [phone]

## 2018-04-19 NOTE — ED Notes (Signed)
See triage note  Presents with body aches ,cough and chest discomfort with cough  sxs' started on Thursday  Denies any fever and afebrile on arrival

## 2018-04-19 NOTE — ED Triage Notes (Signed)
Pt c/o cough with sinus and chest congestion since Thursday, states he has been taking cough syrup with no relief..Marland Kitchen

## 2020-12-04 ENCOUNTER — Emergency Department: Payer: Self-pay

## 2020-12-04 ENCOUNTER — Emergency Department
Admission: EM | Admit: 2020-12-04 | Discharge: 2020-12-04 | Disposition: A | Discharge status: Home / Self Care | Payer: Self-pay | Attending: Emergency Medicine | Admitting: Emergency Medicine

## 2020-12-04 ENCOUNTER — Other Ambulatory Visit: Payer: Self-pay

## 2020-12-04 DIAGNOSIS — M1990 Unspecified osteoarthritis, unspecified site: Secondary | ICD-10-CM | POA: Insufficient documentation

## 2020-12-04 DIAGNOSIS — I10 Essential (primary) hypertension: Secondary | ICD-10-CM | POA: Insufficient documentation

## 2020-12-04 DIAGNOSIS — Z87891 Personal history of nicotine dependence: Secondary | ICD-10-CM | POA: Insufficient documentation

## 2020-12-04 DIAGNOSIS — M1712 Unilateral primary osteoarthritis, left knee: Secondary | ICD-10-CM

## 2020-12-04 MED ORDER — ACETAMINOPHEN 325 MG PO TABS
650.0000 mg | ORAL_TABLET | Freq: Once | ORAL | Status: AC
  Administered 2020-12-04: 650 mg via ORAL
  Filled 2020-12-04: qty 2

## 2020-12-04 MED ORDER — MELOXICAM 7.5 MG PO TABS
15.0000 mg | ORAL_TABLET | Freq: Once | ORAL | Status: AC
Start: 2020-12-04 — End: 2020-12-04
  Administered 2020-12-04: 15 mg via ORAL
  Filled 2020-12-04: qty 2

## 2020-12-04 MED ORDER — 1ST MEDX-PATCH/ LIDOCAINE 4-0.0375-5-20 % EX PTCH: 1.0000 | MEDICATED_PATCH | Freq: Two times a day (BID) | CUTANEOUS | 0 refills | Status: AC | PRN

## 2020-12-04 MED ORDER — MELOXICAM 15 MG PO TABS: 15.0000 mg | ORAL_TABLET | Freq: Every day | ORAL | 0 refills | Status: AC

## 2020-12-04 MED ORDER — LIDOCAINE 5 % EX PTCH
1.0000 | MEDICATED_PATCH | CUTANEOUS | Status: DC
  Administered 2020-12-04: 1 via TRANSDERMAL
  Filled 2020-12-04: qty 1

## 2020-12-04 NOTE — ED Provider Notes (Signed)
[facility] Emergency Department Provider Note  ____________________________________________   Event Date/Time   First MD Initiated Contact with Patient 12/04/20 1550     (approximate)  I have reviewed the triage vital signs and the nursing notes.   HISTORY  Chief Complaint Knee Pain   HPI Barry Novak is a 45 y.o. male who presents to the emergency department for evaluation of left knee pain.  Patient states that it has been intermittently bothering him for up to 1 year, but seems to have been bothering him more over the last several days.  He denies any new injuries, trauma or fall.  He states that the pain is all over in the knee, and worse with deep flexion or full extension.  He does not feel that the knee is giving way on him, and the knee has not been a source of a fall.  He does state that he has history of arthroscopic surgery on the knee and he believes that he may have history of ACL repair but later states that the only scoped the meniscus.  He states this was done "many" years ago in [place], [place].  He denies any back pain, hip or ankle pain, warmth to the area, fever or other systemic symptoms.        Past Medical History:  Diagnosis Date  . Hypertension     There are no problems to display for this patient.   Past Surgical History:  Procedure Laterality Date  . KNEE SURGERY      Prior to Admission medications   Medication Sig Start Date End Date Taking? Authorizing Provider  Lido-Capsaicin-Men-Methyl Sal (1ST MEDX-PATCH/ LIDOCAINE) 4-0.373-12-21 % PTCH Apply 1 patch topically every 12 (twelve) hours as needed. 12/04/20  Yes Lucy Chris, PA  meloxicam (MOBIC) 15 MG tablet Take 1 tablet (15 mg total) by mouth daily for 15 days. 12/04/20 12/19/20 Yes Ching Rabideau, Ruben Gottron, PA  albuterol (PROVENTIL HFA;VENTOLIN HFA) 108 (90 Base) MCG/ACT inhaler Inhale 2 puffs into the lungs every 6 (six) hours as needed for wheezing or  shortness of breath. Patient not taking: Reported on 10/12/2016 01/13/16   Tommi Rumps, PA-C  brompheniramine-pseudoephedrine-DM 30-2-10 MG/5ML syrup Take 5 mLs by mouth 4 (four) times daily as needed. 04/19/18   Joni Reining, PA-C  clindamycin (CLEOCIN) 150 MG capsule Take 2 capsules (300 mg total) by mouth 3 (three) times daily. Patient not taking: Reported on 10/12/2016 01/13/16   Tommi Rumps, PA-C  cyclobenzaprine (FLEXERIL) 10 MG tablet Take 1 tablet (10 mg total) by mouth 2 (two) times daily as needed for muscle spasms. 10/15/16   Tegeler, Canary Brim, MD  cyclobenzaprine (FLEXERIL) 5 MG tablet Take 1 tablet (5 mg total) by mouth 3 (three) times daily as needed (muscle pain). Patient not taking: Reported on 10/12/2016 03/20/15   Devoria Albe, MD  hydrochlorothiazide (HYDRODIURIL) 25 MG tablet Take 1 tablet (25 mg total) by mouth daily. Patient not taking: Reported on 10/12/2016 01/13/16   Tommi Rumps, PA-C  hydrochlorothiazide (MICROZIDE) 12.5 MG capsule Take 1 capsule (12.5 mg total) by mouth daily. 01/10/18 01/10/19  Phineas Semen, MD  HYDROcodone-acetaminophen (NORCO) 5-325 MG tablet Take 1 tablet by mouth every 6 (six) hours as needed for moderate pain. 10/15/16   Tegeler, Canary Brim, MD  HYDROcodone-acetaminophen (NORCO/VICODIN) 5-325 MG tablet Take 1 tablet by mouth every 4 (four) hours as needed for moderate pain. Patient not taking: Reported on 10/12/2016 01/13/16   Bridget Hartshorn  L, PA-C  ibuprofen (ADVIL,MOTRIN) 800 MG tablet Take 1 tablet (800 mg total) by mouth every 8 (eight) hours as needed for moderate pain. 04/19/18   Joni Reining, PA-C    Allergies Tomato  Family History  Problem Relation Age of Onset  . Hypertension Mother   . Diabetes Mother   . Hypertension Father   . Diabetes Sister     Social History Social History   Tobacco Use  . Smoking status: Former Smoker    Types: Cigarettes  . Smokeless tobacco: Never Used  Substance Use Topics  .  Alcohol use: No  . Drug use: No    Review of Systems Constitutional: No fever/chills Eyes: No visual changes. ENT: No sore throat. Cardiovascular: Denies chest pain. Respiratory: Denies shortness of breath. Gastrointestinal: No abdominal pain.  No nausea, no vomiting.  No diarrhea.  No constipation. Genitourinary: Negative for dysuria. Musculoskeletal: +left knee pain, Negative for back pain. Skin: Negative for rash. Neurological: Negative for headaches, focal weakness or numbness.  ____________________________________________   PHYSICAL EXAM:  VITAL SIGNS: ED Triage Vitals  Enc Vitals Group     BP 12/04/20 1448 (!) 149/106     Pulse Rate 12/04/20 1448 81     Resp 12/04/20 1448 18     Temp 12/04/20 1448 98.4 F (36.9 C)     Temp Source 12/04/20 1448 Oral     SpO2 12/04/20 1448 98 %     Weight --      Height --      Head Circumference --      Peak Flow --      Pain Score 12/04/20 1447 10     Pain Loc --      Pain Edu? --      Excl. in GC? --     Constitutional: Alert and oriented. Well appearing and in no acute distress. Eyes: Conjunctivae are normal. PERRL. EOMI. Head: Atraumatic. Nose: No congestion/rhinnorhea. Mouth/Throat: Mucous membranes are moist.   Neck: No stridor.   Musculoskeletal: There is tenderness diffusely about the left knee, worse on the medial aspect.  The patient is able to fully extend and flex the knee, however there is medial pain with deep flexion and a palpable pop is felt medially when coming from flexion back into full extension.  There is no obvious locking of the joint.  Ligamentously stable.  Positive McMurray's with both internal and external rotation of the tibia.  No warmth, joint effusion or redness noted.  Dorsal pedal pulses 2+. Neurologic:  Normal speech and language. No gross focal neurologic deficits are appreciated. No gait instability. Skin:  Skin is warm, dry and intact. No rash noted. Psychiatric: Mood and affect are normal.  Speech and behavior are normal.   ____________________________________________  RADIOLOGY I, Lucy Chris, personally viewed and evaluated these images (plain radiographs) as part of my medical decision making, as well as reviewing the written report by the radiologist.  ED provider interpretation: Extensive arthritis noted about the left knee with no obvious acute fracture or dislocation  ____________________________________________   INITIAL IMPRESSION / ASSESSMENT AND PLAN / ED COURSE  As part of my medical decision making, I reviewed the following data within the electronic MEDICAL RECORD NUMBER Nursing notes reviewed and incorporated, Radiograph reviewed and Notes from prior ED visits        Patient is a 45 year old male who presents to the emergency department for evaluation of left knee pain that has been slowly progressing, worse over the  last several days without any acute fall or trauma.  He had prior surgery many years ago on this knee with unclear procedure details.  See HPI for further details.  On exam, there is no warmth, effusion or redness noted.  He does have full range of motion but is painful, especially when coming from flexion back into extension with a palpable pop.  Ligamentously he is stable but has a positive McMurray's.  X-rays were obtained and demonstrate extensive tricompartmental arthritis with no acute pathology.  Suspect that this is the source of the patient's pain.  Will initiate him on a course of anti-inflammatory, Tylenol and Lidoderm patches in the interim and will have the patient follow-up with outpatient orthopedics regarding these findings.  The patient is amenable with this plan, he stable this time for outpatient management.      ____________________________________________   FINAL CLINICAL IMPRESSION(S) / ED DIAGNOSES  Final diagnoses:  Arthritis of left knee     ED Discharge Orders         Ordered    meloxicam (MOBIC) 15 MG tablet   Daily        12/04/20 1710    Lido-Capsaicin-Men-Methyl Sal (1ST MEDX-PATCH/ LIDOCAINE) 4-0.373-12-21 % PTCH  Every 12 hours PRN        12/04/20 1710          *Please note:  Barry Novak was evaluated in Emergency Department on 12/07/2020 for the symptoms described in the history of present illness. He was evaluated in the context of the global COVID-19 pandemic, which necessitated consideration that the patient might be at risk for infection with the SARS-CoV-2 virus that causes COVID-19. Institutional protocols and algorithms that pertain to the evaluation of patients at risk for COVID-19 are in a state of rapid change based on information released by regulatory bodies including the CDC and federal and state organizations. These policies and algorithms were followed during the patient's care in the ED.  Some ED evaluations and interventions may be delayed as a result of limited staffing during and the pandemic.*   Note:  This document was prepared using Dragon voice recognition software and may include unintentional dictation errors.   Lucy Chris, PA 12/07/20 1437    Sharyn Creamer, MD 12/08/20 [phone]

## 2020-12-04 NOTE — ED Triage Notes (Signed)
Pt comes with c/o left knee pain for few days. Pt denies any injuries.

## 2020-12-04 NOTE — Discharge Instructions (Addendum)
Please use the mobic as presribed once daily with food. You may also use Tylenol, up to 1000mg  4x daily for pain. You have also been prescribed lidocaine patches. If you insurance does not cover these, they are available over the counter as "Extra Strength Salonpas". Follow up with Orthopedics, Dr. .

## 2021-09-03 ENCOUNTER — Emergency Department: Payer: No Typology Code available for payment source

## 2021-09-03 ENCOUNTER — Emergency Department
Admission: EM | Admit: 2021-09-03 | Discharge: 2021-09-03 | Disposition: A | Discharge status: Home / Self Care | Payer: No Typology Code available for payment source | Attending: Emergency Medicine | Admitting: Emergency Medicine

## 2021-09-03 ENCOUNTER — Other Ambulatory Visit: Payer: Self-pay

## 2021-09-03 ENCOUNTER — Encounter: Payer: Self-pay | Admitting: Emergency Medicine

## 2021-09-03 DIAGNOSIS — G44319 Acute post-traumatic headache, not intractable: Secondary | ICD-10-CM | POA: Diagnosis not present

## 2021-09-03 DIAGNOSIS — S3992XA Unspecified injury of lower back, initial encounter: Secondary | ICD-10-CM | POA: Diagnosis present

## 2021-09-03 DIAGNOSIS — S161XXA Strain of muscle, fascia and tendon at neck level, initial encounter: Secondary | ICD-10-CM

## 2021-09-03 DIAGNOSIS — I10 Essential (primary) hypertension: Secondary | ICD-10-CM

## 2021-09-03 DIAGNOSIS — S39012A Strain of muscle, fascia and tendon of lower back, initial encounter: Secondary | ICD-10-CM | POA: Diagnosis not present

## 2021-09-03 MED ORDER — NAPROXEN 500 MG PO TABS: 500.0000 mg | ORAL_TABLET | Freq: Two times a day (BID) | ORAL | 0 refills | Status: AC

## 2021-09-03 MED ORDER — OXYCODONE-ACETAMINOPHEN 7.5-325 MG PO TABS
1.0000 | ORAL_TABLET | Freq: Once | ORAL | Status: AC
  Administered 2021-09-03: 1 via ORAL
  Filled 2021-09-03: qty 1

## 2021-09-03 MED ORDER — HYDROCODONE-ACETAMINOPHEN 5-325 MG PO TABS: 1.0000 | ORAL_TABLET | Freq: Four times a day (QID) | ORAL | 0 refills | Status: AC | PRN

## 2021-09-03 MED ORDER — HYDROCHLOROTHIAZIDE 25 MG PO TABS: 25.0000 mg | ORAL_TABLET | Freq: Every day | ORAL | 1 refills | Status: AC

## 2021-09-03 MED ORDER — METHOCARBAMOL 500 MG PO TABS: ORAL_TABLET | ORAL | 0 refills | Status: AC

## 2021-09-03 NOTE — Discharge Instructions (Addendum)
Follow-up with your primary care provider or [facility] acute care if any continued problems or concerns.  You will be sore for approximately 4 to 5 days and tomorrow be sore in places that are not currently giving you any problems.  Use ice or heat to your muscles as needed for discomfort.  Medication was sent to your pharmacy to take only as needed.  The hydrocodone is every 6 hours as needed for pain and methocarbamol 1 or 2 tablets every 6 hours as needed for muscle spasms.  Do not take these medications if you are driving or operating machinery as it could cause drowsiness and increase your risk for injury.  The naproxen is twice a day with food and this can be taken daily without any drowsiness.  Also follow-up with your primary care provider to have your blood pressure rechecked as it was elevated in the emergency department.  Call tomorrow to make an appointment.  A prescription for your medication was refilled until you can be seen.
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
## 2024-06-22 ENCOUNTER — Other Ambulatory Visit: Payer: Self-pay

## 2024-06-22 ENCOUNTER — Encounter (HOSPITAL_COMMUNITY): Payer: Self-pay | Admitting: Emergency Medicine

## 2024-06-22 ENCOUNTER — Emergency Department (HOSPITAL_COMMUNITY): Payer: Self-pay

## 2024-06-22 ENCOUNTER — Emergency Department (HOSPITAL_COMMUNITY)
Admission: EM | Admit: 2024-06-22 | Discharge: 2024-06-23 | Discharge status: Against Medical Advice | Payer: Self-pay | Attending: Emergency Medicine | Admitting: Emergency Medicine

## 2024-06-22 DIAGNOSIS — R0789 Other chest pain: Secondary | ICD-10-CM | POA: Insufficient documentation

## 2024-06-22 DIAGNOSIS — Z5321 Procedure and treatment not carried out due to patient leaving prior to being seen by health care provider: Secondary | ICD-10-CM | POA: Insufficient documentation

## 2024-06-22 LAB — CBC
HCT: 47.5 % (ref 39.0–52.0)
Hemoglobin: 16.7 g/dL (ref 13.0–17.0)
MCH: 26.7 pg (ref 26.0–34.0)
MCHC: 35.2 g/dL (ref 30.0–36.0)
MCV: 75.9 fL — ABNORMAL LOW (ref 80.0–100.0)
Platelets: 354 K/uL (ref 150–400)
RBC: 6.26 MIL/uL — ABNORMAL HIGH (ref 4.22–5.81)
RDW: 13.4 % (ref 11.5–15.5)
WBC: 11.1 K/uL — ABNORMAL HIGH (ref 4.0–10.5)
nRBC: 0 % (ref 0.0–0.2)

## 2024-06-22 LAB — BASIC METABOLIC PANEL WITH GFR
Anion gap: 14 (ref 5–15)
BUN: 14 mg/dL (ref 6–20)
CO2: 22 mmol/L (ref 22–32)
Calcium: 9.3 mg/dL (ref 8.9–10.3)
Chloride: 100 mmol/L (ref 98–111)
Creatinine, Ser: 1.25 mg/dL — ABNORMAL HIGH (ref 0.61–1.24)
GFR, Estimated: >60 mL/min (ref >60)
Glucose, Bld: 118 mg/dL — ABNORMAL HIGH (ref 70–99)
Potassium: 3.4 mmol/L — ABNORMAL LOW (ref 3.5–5.1)
Sodium: 136 mmol/L (ref 135–145)

## 2024-06-22 LAB — TROPONIN I (HIGH SENSITIVITY): Troponin I (High Sensitivity): 8 ng/L (ref <18)

## 2024-06-22 NOTE — ED Triage Notes (Signed)
 Patient from home with chest pain describes it as an ache, intermittently. Unsure if he just pulled a muscle. Denies shob. AOX4. Denies n/v/d.

## 2024-06-23 LAB — TROPONIN I (HIGH SENSITIVITY): Troponin I (High Sensitivity): 6 ng/L (ref <18)

## 2024-06-23 NOTE — ED Notes (Signed)
 Patient did not want to stay. He stated that he felt better. Taking him OTF
